# Patient Record
Sex: Female | Born: 1985 | Race: White | Hispanic: No | Marital: Married | Smoking: Never smoker
Health system: Southern US, Community
[De-identification: ages and names within clinical notes are randomized; demographics above are authoritative.]

## PROBLEM LIST (undated history)

## (undated) DIAGNOSIS — Z9109 Other allergy status, other than to drugs and biological substances: Secondary | ICD-10-CM

## (undated) DIAGNOSIS — R519 Headache, unspecified: Secondary | ICD-10-CM

## (undated) DIAGNOSIS — J302 Other seasonal allergic rhinitis: Secondary | ICD-10-CM

## (undated) DIAGNOSIS — O24419 Gestational diabetes mellitus in pregnancy, unspecified control: Secondary | ICD-10-CM

## (undated) DIAGNOSIS — Z8619 Personal history of other infectious and parasitic diseases: Secondary | ICD-10-CM

## (undated) DIAGNOSIS — R87629 Unspecified abnormal cytological findings in specimens from vagina: Secondary | ICD-10-CM

## (undated) DIAGNOSIS — R51 Headache: Secondary | ICD-10-CM

## (undated) DIAGNOSIS — G43909 Migraine, unspecified, not intractable, without status migrainosus: Secondary | ICD-10-CM

## (undated) HISTORY — DX: Unspecified abnormal cytological findings in specimens from vagina: R87.629

## (undated) HISTORY — DX: Personal history of other infectious and parasitic diseases: Z86.19

## (undated) HISTORY — DX: Other seasonal allergic rhinitis: J30.2

## (undated) HISTORY — DX: Migraine, unspecified, not intractable, without status migrainosus: G43.909

## (undated) HISTORY — DX: Other allergy status, other than to drugs and biological substances: Z91.09

## (undated) HISTORY — DX: Headache, unspecified: R51.9

## (undated) HISTORY — PX: DILATION AND CURETTAGE OF UTERUS: SHX78

## (undated) HISTORY — DX: Headache: R51

## (undated) HISTORY — PX: WISDOM TOOTH EXTRACTION: SHX21

---

## 2014-12-22 LAB — OB RESULTS CONSOLE GBS: GBS: NEGATIVE

## 2014-12-30 ENCOUNTER — Encounter: Payer: Self-pay | Admitting: Family Medicine

## 2014-12-30 ENCOUNTER — Encounter (INDEPENDENT_AMBULATORY_CARE_PROVIDER_SITE_OTHER): Payer: Self-pay

## 2014-12-30 ENCOUNTER — Ambulatory Visit (INDEPENDENT_AMBULATORY_CARE_PROVIDER_SITE_OTHER): Payer: BC Managed Care – PPO | Admitting: Family Medicine

## 2014-12-30 VITALS — BP 131/83 | HR 93 | Ht 65.0 in | Wt 230.0 lb

## 2014-12-30 DIAGNOSIS — M25571 Pain in right ankle and joints of right foot: Secondary | ICD-10-CM

## 2014-12-30 NOTE — Patient Instructions (Signed)
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 Ice the area for 15 minutes at a time, 3-4 times a day Aleve 2 tabs twice a day with food OR ibuprofen 3 tabs three times a day with food for pain and inflammation for 7-10 days then as needed. Elevate above the level of your heart when possible Use ankle brace to help with stability for next 4-6 weeks. Start theraband strengthening exercises - once a day 3 sets of 10 for next 6 weeks. Wear shoes with good arch support or add arch support (something like dr. Jari Sportsmanscholls active series insoles). Consider physical therapy for strengthening and balance exercises. I don't think x-rays are necessary at this point - you did not have an injury and do not have focal bony tenderness. Follow up with me in 1 month to 6 weeks.

## 2015-01-05 DIAGNOSIS — M25571 Pain in right ankle and joints of right foot: Secondary | ICD-10-CM | POA: Insufficient documentation

## 2015-01-05 NOTE — Assessment & Plan Note (Signed)
2/2 instability and peroneal tendinopathy.  Icing, nsaids. Ankle brace, better arch support.  Start home exercise program daily.  Consider physical therapy.  F/u in 1 month to 6 weeks.

## 2015-01-05 NOTE — Progress Notes (Signed)
PCP: No primary care provider on file.  Subjective:   HPI: Patient is a 29 y.o. female here for right ankle pain.  Patient denies known injury. She states about a week ago she started getting lateral right ankle pain and swelling. Worse by end of day. Is a Engineer, siteschool teacher and stands a lot. Has been icing, elevating. Not taking any medications.  No past medical history on file.  No current outpatient prescriptions on file prior to visit.   No current facility-administered medications on file prior to visit.    No past surgical history on file.  Allergies  Allergen Reactions  . Amoxicillin   . Hydrocodeine [Dihydrocodeine]   . Penicillins     History   Social History  . Marital Status: Single    Spouse Name: N/A  . Number of Children: N/A  . Years of Education: N/A   Occupational History  . Not on file.   Social History Main Topics  . Smoking status: Never Smoker   . Smokeless tobacco: Not on file  . Alcohol Use: Not on file  . Drug Use: Not on file  . Sexual Activity: Not on file   Other Topics Concern  . Not on file   Social History Narrative  . No narrative on file    No family history on file.  BP 131/83 mmHg  Pulse 93  Ht 5\' 5"  (1.651 m)  Wt 230 lb (104.327 kg)  BMI 38.27 kg/m2  Review of Systems: See HPI above.    Objective:  Physical Exam:  Gen: NAD  Right ankle: Mild swelling peroneal tendon area.  No bruising, other deformity.  Mild-mod overpronation. FROM with pain on ext rotation. TTP peroneal tendon, ATFL.   2+ ant drawer, negative talar tilt.   Negative syndesmotic compression. Thompsons test negative. NV intact distally.    Assessment & Plan:  1. Right ankle pain - 2/2 instability and peroneal tendinopathy.  Icing, nsaids. Ankle brace, better arch support.  Start home exercise program daily.  Consider physical therapy.  F/u in 1 month to 6 weeks.

## 2015-01-12 ENCOUNTER — Encounter: Payer: Self-pay | Admitting: Physician Assistant

## 2015-01-12 ENCOUNTER — Ambulatory Visit (INDEPENDENT_AMBULATORY_CARE_PROVIDER_SITE_OTHER): Payer: BC Managed Care – PPO | Admitting: Physician Assistant

## 2015-01-12 VITALS — BP 132/85 | HR 82 | Temp 98.0°F | Resp 16 | Ht 65.0 in | Wt 249.1 lb

## 2015-01-12 DIAGNOSIS — J019 Acute sinusitis, unspecified: Secondary | ICD-10-CM | POA: Diagnosis not present

## 2015-01-12 DIAGNOSIS — B369 Superficial mycosis, unspecified: Secondary | ICD-10-CM | POA: Diagnosis not present

## 2015-01-12 DIAGNOSIS — J309 Allergic rhinitis, unspecified: Secondary | ICD-10-CM | POA: Diagnosis not present

## 2015-01-12 DIAGNOSIS — B9689 Other specified bacterial agents as the cause of diseases classified elsewhere: Secondary | ICD-10-CM

## 2015-01-12 MED ORDER — LORATADINE-PSEUDOEPHEDRINE ER 10-240 MG PO TB24: 1.0000 | ORAL_TABLET | Freq: Every day | ORAL | Status: DC

## 2015-01-12 MED ORDER — DOXYCYCLINE HYCLATE 100 MG PO CAPS
100.0000 mg | ORAL_CAPSULE | Freq: Two times a day (BID) | ORAL | Status: DC
Start: 2015-01-12 — End: 2015-02-25

## 2015-01-12 MED ORDER — CLOTRIMAZOLE-BETAMETHASONE 1-0.05 % EX CREA
1.0000 | TOPICAL_CREAM | Freq: Two times a day (BID) | CUTANEOUS | Status: DC
Start: 2015-01-12 — End: 2015-09-23

## 2015-01-12 NOTE — Patient Instructions (Signed)
Please take medications as directed. Continue the Flonase. Stay well hydrated. Keep sinuses flushed out with saline nasal spray.  Sinusitis Sinusitis is redness, soreness, and swelling (inflammation) of the paranasal sinuses. Paranasal sinuses are air pockets within the bones of your face (beneath the eyes, the middle of the forehead, or above the eyes). In healthy paranasal sinuses, mucus is able to drain out, and air is able to circulate through them by way of your nose. However, when your paranasal sinuses are inflamed, mucus and air can become trapped. This can allow bacteria and other germs to grow and cause infection. Sinusitis can develop quickly and last only a short time (acute) or continue over a long period (chronic). Sinusitis that lasts for more than 12 weeks is considered chronic.  CAUSES  Causes of sinusitis include:  Allergies.  Structural abnormalities, such as displacement of the cartilage that separates your nostrils (deviated septum), which can decrease the air flow through your nose and sinuses and affect sinus drainage.  Functional abnormalities, such as when the small hairs (cilia) that line your sinuses and help remove mucus do not work properly or are not present. SYMPTOMS  Symptoms of acute and chronic sinusitis are the same. The primary symptoms are pain and pressure around the affected sinuses. Other symptoms include:  Upper toothache.  Earache.  Headache.  Bad breath.  Decreased sense of smell and taste.  A cough, which worsens when you are lying flat.  Fatigue.  Fever.  Thick drainage from your nose, which often is green and may contain pus (purulent).  Swelling and warmth over the affected sinuses. DIAGNOSIS  Your caregiver will perform a physical exam. During the exam, your caregiver may:  Look in your nose for signs of abnormal growths in your nostrils (nasal polyps).  Tap over the affected sinus to check for signs of infection.  View the  inside of your sinuses (endoscopy) with a special imaging device with a light attached (endoscope), which is inserted into your sinuses. If your caregiver suspects that you have chronic sinusitis, one or more of the following tests may be recommended:  Allergy tests.  Nasal culture A sample of mucus is taken from your nose and sent to a lab and screened for bacteria.  Nasal cytology A sample of mucus is taken from your nose and examined by your caregiver to determine if your sinusitis is related to an allergy. TREATMENT  Most cases of acute sinusitis are related to a viral infection and will resolve on their own within 10 days. Sometimes medicines are prescribed to help relieve symptoms (pain medicine, decongestants, nasal steroid sprays, or saline sprays).  However, for sinusitis related to a bacterial infection, your caregiver will prescribe antibiotic medicines. These are medicines that will help kill the bacteria causing the infection.  Rarely, sinusitis is caused by a fungal infection. In theses cases, your caregiver will prescribe antifungal medicine. For some cases of chronic sinusitis, surgery is needed. Generally, these are cases in which sinusitis recurs more than 3 times per year, despite other treatments. HOME CARE INSTRUCTIONS   Drink plenty of water. Water helps thin the mucus so your sinuses can drain more easily.  Use a humidifier.  Inhale steam 3 to 4 times a day (for example, sit in the bathroom with the shower running).  Apply a warm, moist washcloth to your face 3 to 4 times a day, or as directed by your caregiver.  Use saline nasal sprays to help moisten and clean your sinuses.  Take over-the-counter or prescription medicines for pain, discomfort, or fever only as directed by your caregiver. SEEK IMMEDIATE MEDICAL CARE IF:  You have increasing pain or severe headaches.  You have nausea, vomiting, or drowsiness.  You have swelling around your face.  You have  vision problems.  You have a stiff neck.  You have difficulty breathing. MAKE SURE YOU:   Understand these instructions.  Will watch your condition.  Will get help right away if you are not doing well or get worse. Document Released: 10/01/2005 Document Revised: 12/24/2011 Document Reviewed: 10/16/2011 [facility] Patient Information 2014 [place], [place].

## 2015-01-12 NOTE — Assessment & Plan Note (Signed)
Rx Lotrisone cream. Apply to area twice daily for 2 weeks. Follow-up if symptoms not improving.

## 2015-01-12 NOTE — Assessment & Plan Note (Signed)
Continue Flonase daily. We'll also begin Claritin-D daily over the next week. Then switch to Plain Claritin.

## 2015-01-12 NOTE — Progress Notes (Signed)
Patient presents to clinic today to establish care.  Acute Concerns: Patient complains of 2 weeks of gradually worsening sinus pressure, sinus pain, ear pressure, postnasal drip, sore throat and cough that is productive of thick clear to green sputum. Denies chest pain or shortness of breath. Denies history of asthma. Endorses history of seasonal allergies. Is taking Flonase daily. Denies recent travel or sick contact.  Patient also complains of a red and scaly rash of her right lower extremity that has been present for 2 months. Denies similar rash elsewhere. Denies contact with similar rash. Denies fever, chills or arthralgias.  Chronic Issues: Allergic Rhinitis -- long-standing history. Patient is currently on Flonase daily with only moderate relief of symptoms.  Past Medical History  Diagnosis Date  . History of chicken pox   . Frequent headaches   . Migraine   . Seasonal allergies   . Environmental allergies     Past Surgical History  Procedure Laterality Date  . Wisdom tooth extraction      No current outpatient prescriptions on file prior to visit.   No current facility-administered medications on file prior to visit.    Allergies  Allergen Reactions  . Amoxicillin   . Hydrocodeine [Dihydrocodeine]   . Penicillins     Family History  Problem Relation Age of Onset  . Alcoholism Father     Resolved-Living  . Diabetes Mother     Hulda MarinLiivng  . Diabetes Paternal Grandfather   . Hyperlipidemia Mother   . Heart attack Paternal Grandfather   . Heart disease Paternal Aunt     History   Social History  . Marital Status: Single    Spouse Name: N/A  . Number of Children: N/A  . Years of Education: N/A   Occupational History  . Not on file.   Social History Main Topics  . Smoking status: Never Smoker   . Smokeless tobacco: Never Used  . Alcohol Use: 0.0 oz/week    0 Standard drinks or equivalent per week     Comment: rare -- 3 x month  . Drug Use: No  .  Sexual Activity:    Partners: Male   Other Topics Concern  . Not on file   Social History Narrative   ROS Pertinent ROS are listed in the HPI.   BP 132/85 mmHg  Pulse 82  Temp(Src) 98 F (36.7 C) (Oral)  Resp 16  Ht 5\' 5"  (1.651 m)  Wt 249 lb 2 oz (113.002 kg)  BMI 41.46 kg/m2  SpO2 99%  LMP 01/03/2015  Physical Exam  Constitutional: She is oriented to person, place, and time and well-developed, well-nourished, and in no distress.  HENT:  Head: Normocephalic and atraumatic.  Right Ear: Tympanic membrane, external ear and ear canal normal.  Left Ear: Tympanic membrane, external ear and ear canal normal.  Nose: Mucosal edema and rhinorrhea present. Right sinus exhibits frontal sinus tenderness. Left sinus exhibits frontal sinus tenderness.  Mouth/Throat: Uvula is midline, oropharynx is clear and moist and mucous membranes are normal. No oropharyngeal exudate.  Eyes: Conjunctivae are normal.  Cardiovascular: Normal rate, regular rhythm, normal heart sounds and intact distal pulses.   Pulmonary/Chest: Effort normal and breath sounds normal. No respiratory distress. She has no wheezes. She has no rales. She exhibits no tenderness.  Neurological: She is alert and oriented to person, place, and time.  Skin: Skin is warm and dry.  Psychiatric: Affect normal.  Vitals reviewed.  Assessment/Plan: Acute bacterial sinusitis Rx doxycycline, S patient  is penicillin allergic.  Increase fluids.  Rest.  Saline nasal spray.  Probiotic.  Mucinex as directed.  Humidifier in bedroom. Continue Flonase daily.  Call or return to clinic if symptoms are not improving.    Rhinitis, allergic Continue Flonase daily. We'll also begin Claritin-D daily over the next week. Then switch to Plain Claritin.   Fungal dermatitis Rx Lotrisone cream. Apply to area twice daily for 2 weeks. Follow-up if symptoms not improving.

## 2015-01-12 NOTE — Assessment & Plan Note (Signed)
Rx doxycycline, S patient is penicillin allergic.  Increase fluids.  Rest.  Saline nasal spray.  Probiotic.  Mucinex as directed.  Humidifier in bedroom. Continue Flonase daily.  Call or return to clinic if symptoms are not improving.

## 2015-01-12 NOTE — Progress Notes (Signed)
Pre visit review using our clinic review tool, if applicable. No additional management support is needed unless otherwise documented below in the visit note/SLS  

## 2015-02-22 ENCOUNTER — Ambulatory Visit: Payer: BC Managed Care – PPO | Admitting: Physician Assistant

## 2015-02-25 ENCOUNTER — Ambulatory Visit (INDEPENDENT_AMBULATORY_CARE_PROVIDER_SITE_OTHER): Payer: BC Managed Care – PPO | Admitting: Physician Assistant

## 2015-02-25 ENCOUNTER — Encounter: Payer: Self-pay | Admitting: Physician Assistant

## 2015-02-25 ENCOUNTER — Ambulatory Visit (HOSPITAL_BASED_OUTPATIENT_CLINIC_OR_DEPARTMENT_OTHER)
Admission: RE | Admit: 2015-02-25 | Discharge: 2015-02-25 | Disposition: A | Discharge status: Home / Self Care | Payer: BC Managed Care – PPO | Source: Ambulatory Visit | Attending: Physician Assistant | Admitting: Physician Assistant

## 2015-02-25 VITALS — BP 131/82 | HR 97 | Temp 98.0°F | Wt 260.0 lb

## 2015-02-25 DIAGNOSIS — H6983 Other specified disorders of Eustachian tube, bilateral: Secondary | ICD-10-CM

## 2015-02-25 DIAGNOSIS — M25571 Pain in right ankle and joints of right foot: Secondary | ICD-10-CM | POA: Insufficient documentation

## 2015-02-25 DIAGNOSIS — M25471 Effusion, right ankle: Secondary | ICD-10-CM

## 2015-02-25 DIAGNOSIS — H6993 Unspecified Eustachian tube disorder, bilateral: Secondary | ICD-10-CM

## 2015-02-25 MED ORDER — METHYLPREDNISOLONE ACETATE PF 80 MG/ML IJ SUSP: 80.0000 mg | Freq: Once | INTRAMUSCULAR | Status: DC

## 2015-02-25 MED ORDER — METHYLPREDNISOLONE ACETATE 80 MG/ML IJ SUSP
80.0000 mg | Freq: Once | INTRAMUSCULAR | Status: AC
Start: 2015-02-25 — End: 2015-02-25
  Administered 2015-02-25: 80 mg via INTRAMUSCULAR

## 2015-02-25 MED ORDER — MELOXICAM 15 MG PO TABS: 15.0000 mg | ORAL_TABLET | Freq: Every day | ORAL | Status: DC

## 2015-02-25 NOTE — Progress Notes (Signed)
Pre visit review using our clinic review tool, if applicable. No additional management support is needed unless otherwise documented below in the visit note. 

## 2015-02-25 NOTE — Patient Instructions (Signed)
Please take Mobic daily (start tomorrow). Keep ankle braced and elevated.   Apply ice to the area. Go downstairs for x-ray.  I will call you with your results.  The steroid shot should help ear symptoms but continue the Flonase. Follow-up if symptoms are not resolving.

## 2015-02-25 NOTE — Progress Notes (Signed)
Patient presents to clinic today c/o pressure, pain and popping in R ear over the past month.  Denies fever, chills, URI symptoms or malaise. Does have history of allergies for which she has been using Flonase daily.  Denies decreased hearing but endorses sometimes feels muffled.  Patient also c/o 2 months of right ankle pain and swelling.  Denies trauma or injury.  Denies bruising, weakness or numbness.  Pain is worse with ambulation. Patient endorses she has seen Sports Medicine who did not feel there was anything concerning. No imaging has been performed per patient.  Past Medical History  Diagnosis Date  . History of chicken pox   . Frequent headaches   . Migraine   . Seasonal allergies   . Environmental allergies     Current Outpatient Prescriptions on File Prior to Visit  Medication Sig Dispense Refill  . clotrimazole-betamethasone (LOTRISONE) cream Apply 1 application topically 2 (two) times daily. 30 g 0  . Liraglutide -Weight Management 18 MG/3ML SOPN Inject into the skin daily.    Marland Kitchen. loratadine-pseudoephedrine (CLARITIN-D 24 HOUR) 10-240 MG per 24 hr tablet Take 1 tablet by mouth daily. 30 tablet 0  . Multiple Vitamins-Minerals (AIRBORNE GUMMIES) CHEW Chew by mouth daily as needed.     No current facility-administered medications on file prior to visit.    Allergies  Allergen Reactions  . Amoxicillin   . Hydrocodeine [Dihydrocodeine]   . Penicillins     Family History  Problem Relation Age of Onset  . Alcoholism Father     Resolved-Living  . Diabetes Mother     Hulda MarinLiivng  . Diabetes Paternal Grandfather   . Hyperlipidemia Mother   . Heart attack Paternal Grandfather   . Heart disease Paternal Aunt     History   Social History  . Marital Status: Single    Spouse Name: N/A  . Number of Children: N/A  . Years of Education: N/A   Social History Main Topics  . Smoking status: Never Smoker   . Smokeless tobacco: Never Used  . Alcohol Use: 0.0 oz/week    0  Standard drinks or equivalent per week     Comment: rare -- 3 x month  . Drug Use: No  . Sexual Activity:    Partners: Male   Other Topics Concern  . None   Social History Narrative   Review of Systems - See HPI.  All other ROS are negative.  BP 131/82 mmHg  Pulse 97  Temp(Src) 98 F (36.7 C)  Wt 260 lb (117.935 kg)  SpO2 94%  Physical Exam  Constitutional: She is well-developed, well-nourished, and in no distress.  HENT:  Head: Normocephalic and atraumatic.  Right Ear: External ear and ear canal normal. Tympanic membrane is not retracted. A middle ear effusion is present.  Left Ear: Tympanic membrane, external ear and ear canal normal.  Nose: Nose normal.  Mouth/Throat: Uvula is midline, oropharynx is clear and moist and mucous membranes are normal. No oropharyngeal exudate.  Cardiovascular: Normal rate, regular rhythm, normal heart sounds and intact distal pulses.   Pulmonary/Chest: Effort normal. No respiratory distress. She has no wheezes. She has no rales. She exhibits no tenderness.  Musculoskeletal:       Right ankle: She exhibits swelling. She exhibits normal range of motion and no ecchymosis. Tenderness. Lateral malleolus and AITFL tenderness found. No medial malleolus, no CF ligament, no posterior TFL, no head of 5th metatarsal and no proximal fibula tenderness found. Achilles tendon normal.  Vitals  reviewed.   No results found for this or any previous visit (from the past 2160 hour(s)).  Assessment/Plan: Right ankle swelling Reiterated supportive measures.  Rx mobic once daily. Continue bracing.  Will check x-ray to further assess.  If negative, recommend PT.   Eustachian tube dysfunction Secondary to allergic rhinitis. No evidence of infection.  Continue Flonase daily and begin decongestant. IM depomedrol given by nursing staff.

## 2015-02-27 DIAGNOSIS — H6980 Other specified disorders of Eustachian tube, unspecified ear: Secondary | ICD-10-CM | POA: Insufficient documentation

## 2015-02-27 DIAGNOSIS — M25471 Effusion, right ankle: Secondary | ICD-10-CM | POA: Insufficient documentation

## 2015-02-27 NOTE — Assessment & Plan Note (Signed)
Reiterated supportive measures.  Rx mobic once daily. Continue bracing.  Will check x-ray to further assess.  If negative, recommend PT.

## 2015-02-27 NOTE — Assessment & Plan Note (Signed)
Secondary to allergic rhinitis. No evidence of infection.  Continue Flonase daily and begin decongestant. IM depomedrol given by nursing staff.

## 2015-05-10 ENCOUNTER — Encounter: Payer: Self-pay | Admitting: Physician Assistant

## 2015-05-10 ENCOUNTER — Ambulatory Visit (INDEPENDENT_AMBULATORY_CARE_PROVIDER_SITE_OTHER): Payer: BC Managed Care – PPO | Admitting: Physician Assistant

## 2015-05-10 VITALS — BP 134/80 | HR 96 | Temp 98.3°F | Ht 65.0 in | Wt 273.8 lb

## 2015-05-10 DIAGNOSIS — J019 Acute sinusitis, unspecified: Secondary | ICD-10-CM

## 2015-05-10 DIAGNOSIS — B9689 Other specified bacterial agents as the cause of diseases classified elsewhere: Secondary | ICD-10-CM | POA: Insufficient documentation

## 2015-05-10 LAB — POCT URINE PREGNANCY: PREG TEST UR: NEGATIVE

## 2015-05-10 MED ORDER — METHYLPREDNISOLONE ACETATE 40 MG/ML IJ SUSP
40.0000 mg | Freq: Once | INTRAMUSCULAR | Status: AC
  Administered 2015-05-10: 40 mg via INTRAMUSCULAR

## 2015-05-10 MED ORDER — DOXYCYCLINE HYCLATE 100 MG PO CAPS: 100.0000 mg | ORAL_CAPSULE | Freq: Two times a day (BID) | ORAL | Status: DC

## 2015-05-10 NOTE — Patient Instructions (Signed)
Please take antibiotic as directed.  Increase fluid intake.  Use Saline nasal spray.  Take a daily multivitamin. Continue Claritin daily.  Place a humidifier in the bedroom. The steroid shot given will help to reduce pressure and inflammation. Alternate tylenol and ibuprofen if needed for headache.  Please call or return clinic if symptoms are not improving.  Sinusitis Sinusitis is redness, soreness, and swelling (inflammation) of the paranasal sinuses. Paranasal sinuses are air pockets within the bones of your face (beneath the eyes, the middle of the forehead, or above the eyes). In healthy paranasal sinuses, mucus is able to drain out, and air is able to circulate through them by way of your nose. However, when your paranasal sinuses are inflamed, mucus and air can become trapped. This can allow bacteria and other germs to grow and cause infection. Sinusitis can develop quickly and last only a short time (acute) or continue over a long period (chronic). Sinusitis that lasts for more than 12 weeks is considered chronic.  CAUSES  Causes of sinusitis include:  Allergies.  Structural abnormalities, such as displacement of the cartilage that separates your nostrils (deviated septum), which can decrease the air flow through your nose and sinuses and affect sinus drainage.  Functional abnormalities, such as when the small hairs (cilia) that line your sinuses and help remove mucus do not work properly or are not present. SYMPTOMS  Symptoms of acute and chronic sinusitis are the same. The primary symptoms are pain and pressure around the affected sinuses. Other symptoms include:  Upper toothache.  Earache.  Headache.  Bad breath.  Decreased sense of smell and taste.  A cough, which worsens when you are lying flat.  Fatigue.  Fever.  Thick drainage from your nose, which often is green and may contain pus (purulent).  Swelling and warmth over the affected sinuses. DIAGNOSIS  Your  caregiver will perform a physical exam. During the exam, your caregiver may:  Look in your nose for signs of abnormal growths in your nostrils (nasal polyps).  Tap over the affected sinus to check for signs of infection.  View the inside of your sinuses (endoscopy) with a special imaging device with a light attached (endoscope), which is inserted into your sinuses. If your caregiver suspects that you have chronic sinusitis, one or more of the following tests may be recommended:  Allergy tests.  Nasal culture A sample of mucus is taken from your nose and sent to a lab and screened for bacteria.  Nasal cytology A sample of mucus is taken from your nose and examined by your caregiver to determine if your sinusitis is related to an allergy. TREATMENT  Most cases of acute sinusitis are related to a viral infection and will resolve on their own within 10 days. Sometimes medicines are prescribed to help relieve symptoms (pain medicine, decongestants, nasal steroid sprays, or saline sprays).  However, for sinusitis related to a bacterial infection, your caregiver will prescribe antibiotic medicines. These are medicines that will help kill the bacteria causing the infection.  Rarely, sinusitis is caused by a fungal infection. In theses cases, your caregiver will prescribe antifungal medicine. For some cases of chronic sinusitis, surgery is needed. Generally, these are cases in which sinusitis recurs more than 3 times per year, despite other treatments. HOME CARE INSTRUCTIONS   Drink plenty of water. Water helps thin the mucus so your sinuses can drain more easily.  Use a humidifier.  Inhale steam 3 to 4 times a day (for example, sit  in the bathroom with the shower running).  Apply a warm, moist washcloth to your face 3 to 4 times a day, or as directed by your caregiver.  Use saline nasal sprays to help moisten and clean your sinuses.  Take over-the-counter or prescription medicines for pain,  discomfort, or fever only as directed by your caregiver. SEEK IMMEDIATE MEDICAL CARE IF:  You have increasing pain or severe headaches.  You have nausea, vomiting, or drowsiness.  You have swelling around your face.  You have vision problems.  You have a stiff neck.  You have difficulty breathing. MAKE SURE YOU:   Understand these instructions.  Will watch your condition.  Will get help right away if you are not doing well or get worse. Document Released: 10/01/2005 Document Revised: 12/24/2011 Document Reviewed: 10/16/2011 [facility] Patient Information 2014 [place], [place].

## 2015-05-10 NOTE — Progress Notes (Signed)
   History of Present Illness: Alicia Christensen is a 29 y.o. female who present to the clinic today complaining of sinus pressure, sinus pain, headache and nasal congestion. Patient endorses left ear pressure and pain.  Patient denies cough, shortness of breath, chest pain.   History: Past Medical History  Diagnosis Date  . History of chicken pox   . Frequent headaches   . Migraine   . Seasonal allergies   . Environmental allergies     Current outpatient prescriptions:  .  clotrimazole-betamethasone (LOTRISONE) cream, Apply 1 application topically 2 (two) times daily., Disp: 30 g, Rfl: 0 .  loratadine-pseudoephedrine (CLARITIN-D 24 HOUR) 10-240 MG per 24 hr tablet, Take 1 tablet by mouth daily., Disp: 30 tablet, Rfl: 0 .  meloxicam (MOBIC) 15 MG tablet, Take 1 tablet (15 mg total) by mouth daily., Disp: 30 tablet, Rfl: 0 .  Multiple Vitamins-Minerals (AIRBORNE GUMMIES) CHEW, Chew by mouth daily as needed., Disp: , Rfl:  .  doxycycline (VIBRAMYCIN) 100 MG capsule, Take 1 capsule (100 mg total) by mouth 2 (two) times daily., Disp: 20 capsule, Rfl: 0  Current facility-administered medications:  .  methylPREDNISolone acetate (DEPO-MEDROL) injection 40 mg, 40 mg, Intramuscular, Once, Waldon Merl, PA-C Allergies  Allergen Reactions  . Amoxicillin   . Hydrocodeine [Dihydrocodeine]   . Penicillins    Family History  Problem Relation Age of Onset  . Alcoholism Father     Resolved-Living  . Diabetes Mother     Hulda Marin  . Diabetes Paternal Grandfather   . Hyperlipidemia Mother   . Heart attack Paternal Grandfather   . Heart disease Paternal Aunt    History   Social History  . Marital Status: Single    Spouse Name: N/A  . Number of Children: N/A  . Years of Education: N/A   Social History Main Topics  . Smoking status: Never Smoker   . Smokeless tobacco: Never Used  . Alcohol Use: 0.0 oz/week    0 Standard drinks or equivalent per week     Comment: rare -- 3 x month  .  Drug Use: No  . Sexual Activity:    Partners: Male   Other Topics Concern  . None   Social History Narrative    Review of Systems: See HPI.  All other ROS are negative.  Physical Examination: BP 134/80 mmHg  Pulse 96  Temp(Src) 98.3 F (36.8 C) (Oral)  Ht  (1.651 m)  Wt 273 lb 12.8 oz (124.195 kg)  BMI 45.56 kg/m2  SpO2 98%  LMP 04/17/2015  General appearance: alert, cooperative and appears stated age Head: Normocephalic, without obvious abnormality, atraumatic, sinuses tender to percussion Eyes: conjunctivae/corneas clear. PERRL, EOM's intact. Fundi benign. Ears: normal TM's and external ear canals both ears Nose: moderate congestion, turbinates swollen, sinus tenderness left Throat: lips, mucosa, and tongue normal; teeth and gums normal Neck: no adenopathy, no carotid bruit, no JVD, supple, symmetrical, trachea midline and thyroid not enlarged, symmetric, no tenderness/mass/nodules Lungs: clear to auscultation bilaterally Chest wall: no tenderness  Labs/Diagnostics: Urine pregnancy -- negative.  Assessment/Plan: Acute bacterial sinusitis Urine pregnancy checked as patient is trying to conceive. LMP 1st week of July. Urine pregnancy negative. Rx Doxycycline.  40 IM Depomedrol given.  Increase fluids.  Rest.  Saline nasal spray.  Probiotic.  Mucinex as directed.  Humidifier in bedroom.  Call or return to clinic if symptoms are not improving.

## 2015-05-10 NOTE — Assessment & Plan Note (Signed)
Urine pregnancy checked as patient is trying to conceive. LMP 1st week of July. Urine pregnancy negative. Rx Doxycycline.  40 IM Depomedrol given.  Increase fluids.  Rest.  Saline nasal spray.  Probiotic.  Mucinex as directed.  Humidifier in bedroom.  Call or return to clinic if symptoms are not improving.

## 2015-05-10 NOTE — Progress Notes (Signed)
Pre visit review using our clinic review tool, if applicable. No additional management support is needed unless otherwise documented below in the visit note. 

## 2015-06-01 ENCOUNTER — Telehealth: Payer: Self-pay | Admitting: Physician Assistant

## 2015-06-01 NOTE — Telephone Encounter (Signed)
She would need reassessment to assess neef for further antibiotics especially giving we now know she is pregnant

## 2015-06-01 NOTE — Telephone Encounter (Signed)
Patient's mother states Keyara has been having a headache, cough, and congestion.  She was previously seen for sinus infection and ear concerns-The fluid behind her ear has resolved.  Symptoms improved but did not resolve fully and has now gotten worse. She is now [redacted] weeks pregnant, and wants to make sure anything she takes is safe for pregnancy.  Please advise.

## 2015-06-01 NOTE — Telephone Encounter (Signed)
Caller name:Karen Relationship to patient:mother Can be reached:[phone] Pharmacy:  Reason for call:Got antibiotics a few weeks ago but not all the way better.  Should she repeat antibiotic or what   She is pregnant

## 2015-06-01 NOTE — Telephone Encounter (Signed)
Notified mom and she stated she will let her daughter know to schedule (not available currently).

## 2015-06-15 LAB — OB RESULTS CONSOLE ANTIBODY SCREEN: ANTIBODY SCREEN: NEGATIVE

## 2015-06-15 LAB — OB RESULTS CONSOLE GC/CHLAMYDIA
Chlamydia: NEGATIVE
GC PROBE AMP, GENITAL: NEGATIVE

## 2015-06-15 LAB — OB RESULTS CONSOLE RPR: RPR: NONREACTIVE

## 2015-06-15 LAB — OB RESULTS CONSOLE HEPATITIS B SURFACE ANTIGEN: Hepatitis B Surface Ag: NEGATIVE

## 2015-06-15 LAB — OB RESULTS CONSOLE ABO/RH: RH Type: POSITIVE

## 2015-06-15 LAB — OB RESULTS CONSOLE HIV ANTIBODY (ROUTINE TESTING): HIV: NONREACTIVE

## 2015-06-15 LAB — OB RESULTS CONSOLE RUBELLA ANTIBODY, IGM: RUBELLA: IMMUNE

## 2015-08-22 ENCOUNTER — Institutional Professional Consult (permissible substitution): Payer: BC Managed Care – PPO | Admitting: Internal Medicine

## 2015-09-02 ENCOUNTER — Ambulatory Visit (INDEPENDENT_AMBULATORY_CARE_PROVIDER_SITE_OTHER): Payer: BC Managed Care – PPO | Admitting: Internal Medicine

## 2015-09-02 ENCOUNTER — Institutional Professional Consult (permissible substitution): Payer: BC Managed Care – PPO | Admitting: Internal Medicine

## 2015-09-02 ENCOUNTER — Ambulatory Visit (INDEPENDENT_AMBULATORY_CARE_PROVIDER_SITE_OTHER)
Admission: RE | Admit: 2015-09-02 | Discharge: 2015-09-02 | Disposition: A | Discharge status: Home / Self Care | Payer: BC Managed Care – PPO | Source: Ambulatory Visit | Attending: Internal Medicine | Admitting: Internal Medicine

## 2015-09-02 ENCOUNTER — Encounter: Payer: Self-pay | Admitting: Internal Medicine

## 2015-09-02 VITALS — BP 110/78 | HR 98 | Ht 65.0 in | Wt 276.0 lb

## 2015-09-02 DIAGNOSIS — R058 Other specified cough: Secondary | ICD-10-CM | POA: Insufficient documentation

## 2015-09-02 DIAGNOSIS — R05 Cough: Secondary | ICD-10-CM

## 2015-09-02 MED ORDER — PANTOPRAZOLE SODIUM 40 MG PO TBEC: 40.0000 mg | DELAYED_RELEASE_TABLET | Freq: Every day | ORAL | Status: DC

## 2015-09-02 MED ORDER — FAMOTIDINE 20 MG PO TABS: ORAL_TABLET | ORAL | Status: DC

## 2015-09-02 MED ORDER — PREDNISONE 10 MG PO TABS: ORAL_TABLET | ORAL | Status: DC

## 2015-09-02 NOTE — Patient Instructions (Addendum)
Pantoprazole (protonix) 40 mg   Take  30-60 min before first meal of the day and Pepcid (famotidine)  20 mg one @  bedtime until return to office - this is the best way to tell whether stomach acid is contributing to your problem.     For drainage / throat tickle try take CHLORPHENIRAMINE  4 mg - take one every 4 hours as needed - available over the counter- may cause drowsiness so start with just a bedtime dose or two and see how you tolerate it before trying in daytime   For cough > delsym 2tsp every 12 hours as needed    Prednisone 10 mg take  4 each am x 2 days,   2 each am x 2 days,  1 each am x 2 days and stop   Ok to check with Dr Damian Leavellavon's office first and let me know if any of these meds aren't acceptable    GERD (REFLUX)  is an extremely common cause of respiratory symptoms just like yours , many times with no obvious heartburn at all.    It can be treated with medication, but also with lifestyle changes including elevation of the head of your bed (ideally with 6 inch  bed blocks),  Smoking cessation, avoidance of late meals, excessive alcohol, and avoid fatty foods, chocolate, peppermint, colas, red wine, and acidic juices such as orange juice.  NO MINT OR MENTHOL PRODUCTS SO NO COUGH DROPS  USE SUGARLESS CANDY INSTEAD (Jolley ranchers or Stover's or Life Savers) or even ice chips will also do - the key is to swallow to prevent all throat clearing.  NO OIL BASED VITAMINS - use powdered substitutes.   Please remember to go to the  x-ray department downstairs for your tests - we will call you with the results when they are available.

## 2015-09-02 NOTE — Progress Notes (Signed)
Subjective:    Patient ID: Alicia Christensen, female    DOB: 07/15/1986,    MRN: [id number]  HPI  40 yowf never smoker with seasonal asthma never cough with it, mostly rhinitis fall > spring referred by Dr Billy Coast for refractory cough during pregnancy.   09/02/2015 1st [facility] office visit/ Kennah Hehr   Chief Complaint  Patient presents with  . Pulmonary Consult    Referred by Dr Theresa Mulligan. Pt c/o cough and SOB for the past 5 months. Cough is occ prod with clear to yellow sputum. She states that she feels like she is unable to take a good, deep breath.  She coughs until she vomits at least 3 x per day and sometimes loses urinary continence.   onset of symptoms at around 4 weeks of gestation and now at 71 weeksand gradually worse esp at hs  Cough meds just make her nauseated / coughs to point of vomiting/ urinary incont / really min mucus production assoc with min nasal congestion/ sense of pnds - mild doe but not at rest unless during severe coughing fits  No obvious other patterns in day to day or daytime variabilty or assoc  cp or chest tightness, subjective wheeze or overt   hb symptoms. No unusual exp hx or h/o childhood pna/ asthma or knowledge of premature birth.  Sleeping ok without nocturnal  or early am exacerbation  of respiratory  c/o's or need for noct saba. Also denies any obvious fluctuation of symptoms with weather or environmental changes or other aggravating or alleviating factors except as outlined above   Current Medications, Allergies, Complete Past Medical History, Past Surgical History, Family History, and Social History were reviewed in [employer] record.            Review of Systems  Constitutional: Negative for fever, chills and unexpected weight change.  HENT: Positive for congestion and sore throat. Negative for dental problem, ear pain, nosebleeds, postnasal drip, rhinorrhea, sinus pressure, sneezing, trouble swallowing and voice  change.   Eyes: Negative for visual disturbance.  Respiratory: Positive for cough and shortness of breath. Negative for choking.   Cardiovascular: Negative for chest pain and leg swelling.  Gastrointestinal: Positive for abdominal pain. Negative for vomiting and diarrhea.  Genitourinary: Negative for difficulty urinating.  Musculoskeletal: Negative for arthralgias.  Skin: Negative for rash.  Neurological: Positive for headaches. Negative for tremors and syncope.  Hematological: Does not bruise/bleed easily.       Objective:   Physical Exam  amb wf nad  Wt Readings from Last 3 Encounters:  09/02/15 276 lb (125.193 kg)  05/10/15 273 lb 12.8 oz (124.195 kg)  02/25/15 260 lb (117.935 kg)    Vital signs reviewed    HEENT: nl dentition, turbinates, and oropharynx. Nl external ear canals without cough reflex   NECK :  without JVD/Nodes/TM/ nl carotid upstrokes bilaterally   LUNGS: no acc muscle use, clear to A and P bilaterally without cough on insp or exp maneuvers   CV:  RRR  no s3 or murmur or increase in P2, no edema   ABD:  Obese soft and nontender and c/w IUP stated gestation  with nl excursion in the supine position. No bruits or organomegaly, bowel sounds nl  MS:  warm without deformities, calf tenderness, cyanosis or clubbing  SKIN: warm and dry without lesions    NEURO:  alert, approp, no deficits     CXR PA and Lateral:   09/02/2015 :  I personally reviewed images and agree with radiology impression as follows:    Mild central bronchial thickening. No infiltrate, consolidation or collapse.          Assessment & Plan:

## 2015-09-03 ENCOUNTER — Encounter: Payer: Self-pay | Admitting: Internal Medicine

## 2015-09-03 NOTE — Assessment & Plan Note (Signed)
Body mass index is 45.93 kg/(m^2).  No results found for: TSH   Contributing to gerd tendency/ doe/reviewed the need and the process to achieve and maintain neg calorie balance > defer f/u primary care including intermittently monitoring thyroid status

## 2015-09-03 NOTE — Assessment & Plan Note (Signed)
The most common causes of chronic cough in immunocompetent adults include the following: upper airway cough syndrome (UACS), previously referred to as postnasal drip syndrome (PNDS), which is caused by variety of rhinosinus conditions; (2) asthma; (3) GERD; (4) chronic bronchitis from cigarette smoking or other inhaled environmental irritants; (5) nonasthmatic eosinophilic bronchitis; and (6) bronchiectasis.   These conditions, singly or in combination, have accounted for up to 94% of the causes of chronic cough in prospective studies.   Other conditions have constituted no >6% of the causes in prospective studies These have included bronchogenic carcinoma, chronic interstitial pneumonia, sarcoidosis, left ventricular failure, ACEI-induced cough, and aspiration from a condition associated with pharyngeal dysfunction.    Chronic cough is often simultaneously caused by more than one condition. A single cause has been found from 38 to 82% of the time, multiple causes from 18 to 62%. Multiply caused cough has been the result of three diseases up to 42% of the time.       Based on hx and exam, this is most likely:  Classic Upper airway cough syndrome, so named because it's frequently impossible to sort out how much is  CR/sinusitis with freq throat clearing (which can be related to primary GERD)   vs  causing  secondary (" extra esophageal")  GERD from wide swings in gastric pressure that occur with throat clearing, often  promoting self use of mint and menthol lozenges that reduce the lower esophageal sphincter tone and exacerbate the problem further in a cyclical fashion.   These are the same pts (now being labeled as having "irritable larynx syndrome" by some cough centers) who not infrequently have a history of having failed to tolerate ace inhibitors,  dry powder inhalers or biphosphonates or report having atypical reflux symptoms that don't respond to standard doses of PPI , and are easily confused as  having aecopd or asthma flares by even experienced allergists/ pulmonologists.   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  I had an extended discussion with the patient reviewing all relevant studies completed to date and  lasting 35 min/60 min ov   1) Discussed in detail all the  indications, usual  risks and alternatives  relative to the benefits with patient who agrees to proceed with conservativew/u for now with just a cxr then 2 week trial of meds which have no strict contraindication in pregnancy in this pt who is exposing the fetus to extremely high intrabd pressure and probably hyperventilating as well which is not good for fetal oxygen delivery.  2) Each maintenance medication was reviewed in detail including most importantly the difference between maintenance and prns and under what circumstances the prns are to be triggered using an action plan format that is not reflected in the computer generated alphabetically organized AVS.    Please see instructions for details which were reviewed in writing and the patient given a copy highlighting the part that I personally wrote and discussed at today's ov.   See instructions for specific recommendations which were reviewed directly with the patient who was given a copy with highlighter outlining the key components.

## 2015-09-20 ENCOUNTER — Telehealth: Payer: Self-pay | Admitting: Internal Medicine

## 2015-09-20 NOTE — Telephone Encounter (Signed)
Alicia Christensen is aware of MW's recommendation. She asks that we contact the pt directly to make the appointment. Spoke with pt's mother. OV has been scheduled for 09/23/15 at 4pm (pt could only come in after 3pm). Nothing further was needed.

## 2015-09-20 NOTE — Telephone Encounter (Signed)
Pt added back to schedule 09/23/15 with MW at 4pm - okayed in previous telephone note to double book at 4pm Nothing further needed.

## 2015-09-20 NOTE — Telephone Encounter (Signed)
Will need ov with all meds in hand, ok to overbook

## 2015-09-20 NOTE — Telephone Encounter (Signed)
Spoke with Delaney Meigsamara at Dr. Damian Leavellavon's office, states that cough is unchanged since last ov in our office.  Pt was seen yesterday by Dr. Rosemary Holmsavon, who is requesting further recs from Dr. Sherene SiresWert re: pt's cough.  At last ov with MW, pt was put on the following regimen for cough: protonix 40mg  qam, pepcid 20mg  qhs, chlorpheniramine 4mg  q4h prn, delsym 2tsp q12h prn, pred taper, and GERD diet.  MW please advise on any further recs for pt.  Thanks!  (be sure to call Delaney Meigsamara back with recs)

## 2015-09-23 ENCOUNTER — Ambulatory Visit (INDEPENDENT_AMBULATORY_CARE_PROVIDER_SITE_OTHER): Payer: BC Managed Care – PPO | Admitting: Internal Medicine

## 2015-09-23 ENCOUNTER — Encounter: Payer: Self-pay | Admitting: Internal Medicine

## 2015-09-23 ENCOUNTER — Ambulatory Visit: Payer: BC Managed Care – PPO | Admitting: Internal Medicine

## 2015-09-23 VITALS — BP 122/86 | HR 102 | Ht 65.0 in | Wt 277.0 lb

## 2015-09-23 DIAGNOSIS — R05 Cough: Secondary | ICD-10-CM | POA: Diagnosis not present

## 2015-09-23 DIAGNOSIS — J309 Allergic rhinitis, unspecified: Secondary | ICD-10-CM | POA: Diagnosis not present

## 2015-09-23 DIAGNOSIS — R058 Other specified cough: Secondary | ICD-10-CM

## 2015-09-23 MED ORDER — FLUTICASONE PROPIONATE 50 MCG/ACT NA SUSP: NASAL | Status: DC

## 2015-09-23 MED ORDER — CEFDINIR 300 MG PO CAPS: 300.0000 mg | ORAL_CAPSULE | Freq: Two times a day (BID) | ORAL | Status: DC

## 2015-09-23 MED ORDER — FLUTTER DEVI: Status: DC

## 2015-09-23 NOTE — Progress Notes (Signed)
Subjective:    Patient ID: Alicia Christensen, female    DOB: March 21, 1986,    MRN: [id number]    Brief patient profile:  32 yowf never smoker with seasonal asthma never cough with it, mostly rhinitis fall > spring referred by Dr Alicia Christensen for refractory cough during pregnancy.   History of Present Illness  09/02/2015 1st [facility] office visit/ [name]   Chief Complaint  Patient presents with  . Pulmonary Consult    Referred by Dr Alicia Christensen. Pt c/o cough and SOB for the past 5 months. Cough is occ prod with clear to yellow sputum. She states that she feels like she is unable to take a good, deep breath.  She coughs until she vomits at least 3 x per day and sometimes loses urinary continence.   onset of symptoms at around 4 weeks of gestation and now at 20 weeks and gradually worse esp at hs  Cough meds just make her nauseated / coughs to point of vomiting/ urinary incont / really min mucus production assoc with min nasal congestion/ sense of pnds - mild doe but not at rest unless during severe coughing fits rec Pantoprazole (protonix) 40 mg   Take  30-60 min before first meal of the day and Pepcid (famotidine)  20 mg one @  bedtime until return to office - this is the best way to tell whether stomach acid is contributing to your problem.   For drainage / throat tickle try take CHLORPHENIRAMINE  4 mg - take one every 4 hours as needed - available over the counter- may cause drowsiness so start with just a bedtime dose or two and see how you tolerate it before trying in daytime  For cough > delsym 2tsp every 12 hours as needed   Prednisone 10 mg take  4 each am x 2 days,   2 each am x 2 days,  1 each am x 2 days and stop  GERD  Diet   09/23/2015  f/u ov/Alicia Christensen re: refractory cough with vomiting  In IUP now about [redacted] weeks gestation  Chief Complaint  Patient presents with  . Follow-up    pt following for upper airway cough syndrome: pt states cough is about the same since she was last  here. pt c.o prod cough white or yellow in color. pt was in [place] on thanskgiving and went to the er for a coughig episode and pulled muscles in her stomach. pt completed a course of prednisone and was perscribed a resue inhailer that she doesnt get much relief from. pt c/o sore throat.   no better with prednisone or albuterol and cough remains harsh and min productive/ 24/7 and sleeping in recliner  Did not get h1 or stay on acid rx as rec   No obvious day to day or daytime variability or assoc mucus plug production or  cp or chest tightness, subjective wheeze or overt sinus or hb symptoms. No unusual exp hx or h/o childhood pna/ asthma or knowledge of premature birth.  Sleeping ok without nocturnal  or early am exacerbation  of respiratory  c/o's or need for noct saba. Also denies any obvious fluctuation of symptoms with weather or environmental changes or other aggravating or alleviating factors except as outlined above   Current Medications, Allergies, Complete Past Medical History, Past Surgical History, Family History, and Social History were reviewed in [employer] record.  ROS  The following are not active complaints unless bolded sore  throat, dysphagia, dental problems, itching, sneezing,  nasal congestion or excess/ purulent secretions, ear ache,   fever, chills, sweats, unintended wt loss, classically pleuritic or exertional cp, hemoptysis,  orthopnea pnd or leg swelling, presyncope, palpitations, abdominal pain, anorexia, nausea, vomiting, diarrhea  or change in bowel or bladder habits, change in stools or urine, dysuria,hematuria,  rash, arthralgias, visual complaints, headache, numbness, weakness or ataxia or problems with walking or coordination,  change in mood/affect or memory.           .        Objective:   Physical Exam  amb despondent wf nad until starts a coughing fit/ classic upper airway features with pseudowheezing   09/23/2015         277  09/02/15 276 lb (125.193 kg)  05/10/15 273 lb 12.8 oz (124.195 kg)  02/25/15 260 lb (117.935 kg)    Vital signs reviewed    HEENT: nl dentition, mod nonspecific swelling bilateral turbinates, and nl oropharynx(pristine) . Nl external ear canals without cough reflex   NECK :  without JVD/Nodes/TM/ nl carotid upstrokes bilaterally   LUNGS: no acc muscle use, clear to A and P bilaterally without cough on insp or exp maneuvers   CV:  RRR  no s3 or murmur or increase in P2, no edema   ABD:  Obese soft and nontender and c/w IUP stated gestation  with nl excursion in the supine position. No bruits or organomegaly, bowel sounds nl  MS:  warm without deformities, calf tenderness, cyanosis or clubbing  SKIN: warm and dry without lesions    NEURO:  alert, approp, no deficits     CXR PA and Lateral:   09/02/2015 :    I personally reviewed images and agree with radiology impression as follows:    Mild central bronchial thickening. No infiltrate, consolidation or collapse.          Assessment & Plan:

## 2015-09-23 NOTE — Patient Instructions (Addendum)
Every time you feel the urge the cough, cough into the flutter valve  Pantoprazole (protonix) 40 mg   Take  30-60 min before first meal of the day and Pepcid (famotidine)  20 mg one @  bedtime until return to office - this is the best way to tell whether stomach acid is contributing to your problem.    For drainage / throat tickle try take CHLORPHENIRAMINE  4 mg - take one every 4 hours as needed - available over the counter- may cause drowsiness so start with just a bedtime dose or two and see how you tolerate it before trying in daytime   For cough > robitussin per bottle   omnicef 300 mg one twice daily x 7 days   I emphasized that nasal steroids (flonase) have no immediate benefit in terms of improving symptoms.  To help them reached the target tissue, the patient should use Afrin two puffs every 12 hours applied one min before using the nasal steroids.  Afrin should be stopped after no more than 5 days.  If the symptoms worsen, Afrin can be restarted after 5 days off of therapy to prevent rebound congestion from overuse of Afrin.  I also emphasized that in no way are nasal steroids a concern in terms of "addiction".   If any problems with any of these recommendations call me right away  Please schedule a follow up office visit in 2 weeks, sooner if needed

## 2015-09-25 ENCOUNTER — Encounter: Payer: Self-pay | Admitting: Internal Medicine

## 2015-09-25 NOTE — Assessment & Plan Note (Signed)
Body mass index is 46.1 with pregnancy vs baseline obesity   No results found for: TSH   Contributing to gerd tendency/ doe/reviewed the need and the process to achieve and maintain neg calorie balance > defer f/u primary care including intermittently monitoring thyroid status

## 2015-09-25 NOTE — Assessment & Plan Note (Signed)
I emphasized that nasal steroids have no immediate benefit in terms of improving symptoms.  To help them reached the target tissue, the patient should use Afrin two puffs every 12 hours applied one min before using the nasal steroids.  Afrin should be stopped after no more than 5 days.  If the symptoms worsen, Afrin can be restarted after 5 days off of therapy to prevent rebound congestion from overuse of Afrin.  I also emphasized that in no way are nasal steroids a concern in terms of "addiction".  

## 2015-09-25 NOTE — Assessment & Plan Note (Addendum)
Max gerd rx 09/02/2015 > did not maintain on   I had an extended discussion with the patient reviewing all relevant studies completed to date and  lasting 15 to 20 minutes of a 25 minute visit    1) clearly she is coughing so hard she is refluxing so needs to stay on a heavy acid suppression regimen and reflux diet until she returns here.  2) unfortunately there is no satisfactory cough suppressant she can use other than Robitussin because of intolerance. If she were really in distress and not pregnant I would add either Demerol or Dilaudid  at this point but will defer that to Dr. Spero Geraldseran judgment. The bottom line is  If she keeps coughing like this she's going to keep coughing like this.  3) rec add omnicef x 10 day rx to cover ? Sinus source since pcn allergy was not serious and there's only a 10% overlap anyway  4) Unlike when you get a prescription for eyeglasses, it's not possible to always walk out of this or any medical office with a perfect prescription that is immediately effective  based on any test that we offer here.    On the contrary, it may take several weeks for the full impact of changes recommened today - hopefully you will respond well.  If not, then we'll adjust your medication on your next visit accordingly, knowing more then than we can possibly know now.      5)  Each maintenance medication was reviewed in detail including most importantly the difference between maintenance and prns and under what circumstances the prns are to be triggered using an action plan format that is not reflected in the computer generated alphabetically organized AVS.  Advised:  If not able or willing to follow these instructions to the letter needs to contact this office rather than ad lib on the recs or we won't be able to help her   Please see instructions for details which were reviewed in writing and the patient given a copy highlighting the part that I personally wrote and discussed at today's ov.

## 2015-11-08 ENCOUNTER — Telehealth: Payer: Self-pay | Admitting: Physician Assistant

## 2015-11-08 NOTE — Telephone Encounter (Signed)
Relation to WJ:XBJY Call back [phone] Pharmacy:  Reason for call:  Mother called stated that daughter has been coughing since she's been pregnant, OB prescribed antibiotics and its not working and mother would like patient to be seen today please advise.

## 2015-11-08 NOTE — Telephone Encounter (Signed)
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

## 2015-11-08 NOTE — Telephone Encounter (Signed)
I am full. Can you schedule her with another provider here please or at one of our other locations

## 2015-11-09 ENCOUNTER — Encounter (INDEPENDENT_AMBULATORY_CARE_PROVIDER_SITE_OTHER): Payer: Self-pay

## 2015-11-09 ENCOUNTER — Encounter: Payer: Self-pay | Admitting: Physician Assistant

## 2015-11-09 ENCOUNTER — Ambulatory Visit (INDEPENDENT_AMBULATORY_CARE_PROVIDER_SITE_OTHER): Payer: BC Managed Care – PPO | Admitting: Physician Assistant

## 2015-11-09 VITALS — BP 120/72 | HR 104 | Temp 98.3°F | Ht 65.0 in | Wt 288.0 lb

## 2015-11-09 DIAGNOSIS — R058 Other specified cough: Secondary | ICD-10-CM

## 2015-11-09 DIAGNOSIS — R05 Cough: Secondary | ICD-10-CM | POA: Diagnosis not present

## 2015-11-09 MED ORDER — CHLORPHENIRAMINE MALEATE 2 MG/5ML PO SYRP: 2.0000 mg | ORAL_SOLUTION | ORAL | Status: DC | PRN

## 2015-11-09 NOTE — Progress Notes (Signed)
Pre visit review using our clinic review tool, if applicable. No additional management support is needed unless otherwise documented below in the visit note. 

## 2015-11-09 NOTE — Progress Notes (Signed)
Patient presents to clinic today c/o 5 months of a continued dry cough. Has been evaluated by Pulmonology on more than one occasion. Symptoms did not respond to antibiotics. Patient was diagnosed with upper airway cough syndrome and supportive measures were reviewed. Prescription options are limited as patient is currently 6 months pregnant. Patient endorses nasal congestion with runny nose, watery eyes and PND. Denies fever, chills, sinus pain, chest congestion or SOB. Has taken Flonase with little relief in symptoms.  Past Medical History  Diagnosis Date  . History of chicken pox   . Frequent headaches   . Migraine   . Seasonal allergies   . Environmental allergies     Current Outpatient Prescriptions on File Prior to Visit  Medication Sig Dispense Refill  . fluticasone (FLONASE) 50 MCG/ACT nasal spray One twice daily each nostril    . Multiple Vitamins-Minerals (AIRBORNE GUMMIES) CHEW Chew by mouth daily as needed.    . famotidine (PEPCID) 20 MG tablet One at bedtime (Patient not taking: Reported on 09/23/2015) 30 tablet 2  . pantoprazole (PROTONIX) 40 MG tablet Take 1 tablet (40 mg total) by mouth daily. Take 30-60 min before first meal of the day (Patient not taking: Reported on 09/23/2015) 30 tablet 2   No current facility-administered medications on file prior to visit.    Allergies  Allergen Reactions  . Amoxicillin   . Hydrocodeine [Dihydrocodeine]   . Penicillins     Family History  Problem Relation Age of Onset  . Alcoholism Father     Resolved-Living  . Diabetes Mother     Hulda Marin  . Diabetes Paternal Grandfather   . Hyperlipidemia Mother   . Heart attack Paternal Grandfather   . Heart disease Paternal Aunt     Social History   Social History  . Marital Status: Single    Spouse Name: N/A  . Number of Children: N/A  . Years of Education: N/A   Social History Main Topics  . Smoking status: Never Smoker   . Smokeless tobacco: Never Used  . Alcohol Use: 0.0  oz/week    0 Standard drinks or equivalent per week     Comment: rare -- 3 x month  . Drug Use: No  . Sexual Activity:    Partners: Male   Other Topics Concern  . None   Social History Narrative   Review of Systems - See HPI.  All other ROS are negative.  BP 120/72 mmHg  Pulse 104  Temp(Src) 98.3 F (36.8 C) (Oral)  Ht  (1.651 m)  Wt 288 lb (130.636 kg)  BMI 47.93 kg/m2  SpO2 98%  LMP 04/17/2015  Physical Exam  Constitutional: She is oriented to person, place, and time and well-developed, well-nourished, and in no distress.  HENT:  Head: Normocephalic and atraumatic.  Right Ear: Tympanic membrane normal.  Left Ear: Tympanic membrane normal.  Nose: Rhinorrhea present.  Mouth/Throat: Uvula is midline, oropharynx is clear and moist and mucous membranes are normal.  Eyes: Conjunctivae are normal.  Neck: Neck supple.  Cardiovascular: Normal rate, regular rhythm, normal heart sounds and intact distal pulses.   Pulmonary/Chest: Effort normal and breath sounds normal. No respiratory distress. She has no wheezes. She has no rales. She exhibits no tenderness.  Neurological: She is alert and oriented to person, place, and time.  Skin: Skin is warm and dry. No rash noted.  Psychiatric: Affect normal.  Vitals reviewed.   No results found for this or any previous visit (from the  past 2160 hour(s)).  Assessment/Plan: Upper airway cough syndrome Patient has stopped acid reducers. She has been encouraged to restart. Feel her issue is combination of GERD with allergic rhinitis and PND. Recommend increased fluids, saline nasal spray, Benadryl in the evening and Chlor-Trimeton as directed. Follow-up with Pulmonology if not improving.

## 2015-11-09 NOTE — Patient Instructions (Signed)
Continue the Flonase as directed. Take a Benadryl at night and use the Chlor-Trimeton as directed during the day for allergies and cough. Increase fluids and place a humidifier in the bedroom. Suck on hard sugar-free candy to help with cough.   Follow-up with Dr. Sherene Sires if not improving.

## 2015-11-10 NOTE — Assessment & Plan Note (Signed)
Patient has stopped acid reducers. She has been encouraged to restart. Feel her issue is combination of GERD with allergic rhinitis and PND. Recommend increased fluids, saline nasal spray, Benadryl in the evening and Chlor-Trimeton as directed. Follow-up with Pulmonology if not improving.

## 2015-11-23 ENCOUNTER — Inpatient Hospital Stay (HOSPITAL_COMMUNITY)
Admission: AD | Admit: 2015-11-23 | Payer: BC Managed Care – PPO | Source: Ambulatory Visit | Admitting: Obstetrics and Gynecology

## 2015-12-22 LAB — OB RESULTS CONSOLE GBS: GBS: NEGATIVE

## 2016-01-03 ENCOUNTER — Other Ambulatory Visit: Payer: Self-pay | Admitting: Obstetrics and Gynecology

## 2016-01-11 ENCOUNTER — Telehealth (HOSPITAL_COMMUNITY): Payer: Self-pay | Admitting: *Deleted

## 2016-01-11 ENCOUNTER — Encounter (HOSPITAL_COMMUNITY): Payer: Self-pay | Admitting: *Deleted

## 2016-01-11 NOTE — Telephone Encounter (Signed)
Preadmission screen  

## 2016-01-13 ENCOUNTER — Encounter (HOSPITAL_COMMUNITY): Payer: Self-pay | Admitting: *Deleted

## 2016-01-13 ENCOUNTER — Telehealth (HOSPITAL_COMMUNITY): Payer: Self-pay | Admitting: *Deleted

## 2016-01-13 NOTE — Telephone Encounter (Signed)
Preadmission screen  

## 2016-01-14 ENCOUNTER — Other Ambulatory Visit: Payer: Self-pay | Admitting: Obstetrics and Gynecology

## 2016-01-14 NOTE — H&P (Signed)
Alicia Christensen is a 30 y.o. female presenting for IOL for A2DM and gestational HTN. Maternal Medical History:  Reason for admission: Contractions.   Contractions: Onset was less than 1 hour ago.   Frequency: rare.   Perceived severity is mild.    Fetal activity: Perceived fetal activity is normal.   Last perceived fetal movement was within the past hour.    Prenatal complications: no prenatal complications Prenatal Complications - Diabetes: gestational. Diabetes is managed by oral agent (monotherapy).      OB History    Gravida Para Term Preterm AB TAB SAB Ectopic Multiple Living   2 0   1 1         Past Medical History  Diagnosis Date  . Frequent headaches   . Migraine   . Seasonal allergies   . Environmental allergies   . Hx of varicella   . Vaginal Pap smear, abnormal    Past Surgical History  Procedure Laterality Date  . Wisdom tooth extraction    . Dilation and curettage of uterus     Family History: family history includes Alcoholism in her father; Diabetes in her mother and paternal grandfather; Heart attack in her maternal grandfather and paternal grandfather; Heart disease in her paternal aunt; Hyperlipidemia in her mother; Hypertension in her maternal grandmother; Varicose Veins in her mother. Social History:  reports that she has never smoked. She has never used smokeless tobacco. She reports that she drinks alcohol. She reports that she does not use illicit drugs.   Prenatal Transfer Tool  Maternal Diabetes: Yes:  Diabetes Type:  Insulin/Medication controlled Genetic Screening: Normal Maternal Ultrasounds/Referrals: Normal Fetal Ultrasounds or other Referrals:  None Maternal Substance Abuse:  No Significant Maternal Medications:  None Significant Maternal Lab Results:  None Other Comments:  None  Review of Systems  Constitutional: Negative.   All other systems reviewed and are negative.     Last menstrual period 04/17/2015. Maternal Exam:   Uterine Assessment: Contraction strength is mild.  Contraction frequency is rare.   Abdomen: Patient reports no abdominal tenderness. Fetal presentation: vertex  Introitus: Normal vulva. Normal vagina.  Ferning test: not done.  Nitrazine test: not done. Amniotic fluid character: not assessed.  Pelvis: questionable for delivery.   Cervix: Cervix evaluated by digital exam.     Physical Exam  Nursing note and vitals reviewed. Constitutional: She is oriented to person, place, and time. She appears well-developed and well-nourished.  HENT:  Head: Normocephalic and atraumatic.  Neck: Normal range of motion. Neck supple.  Cardiovascular: Normal rate and regular rhythm.   Respiratory: Effort normal and breath sounds normal.  GI: Soft. Bowel sounds are normal.  Genitourinary: Vagina normal and uterus normal.  Musculoskeletal: Normal range of motion.  Neurological: She is alert and oriented to person, place, and time. She has normal reflexes.  Skin: Skin is warm and dry.  Psychiatric: She has a normal mood and affect.    Prenatal labs: ABO, Rh: O/Positive/-- (08/31 0000) Antibody: Negative (08/31 0000) Rubella: Immune (08/31 0000) RPR: Nonreactive (08/31 0000)  HBsAg: Negative (08/31 0000)  HIV: Non-reactive (08/31 0000)  GBS:     Assessment/Plan: 40 week IUP A2DM Gestational HTN Admit Ck labs BS q 4   Anayeli Arel J 01/14/2016, 10:07 PM

## 2016-01-15 ENCOUNTER — Encounter (HOSPITAL_COMMUNITY)
Admission: RE | Disposition: A | Discharge status: Home / Self Care | Payer: Self-pay | Source: Ambulatory Visit | Attending: Obstetrics and Gynecology

## 2016-01-15 ENCOUNTER — Inpatient Hospital Stay (HOSPITAL_COMMUNITY): Payer: BC Managed Care – PPO | Admitting: Anesthesiology

## 2016-01-15 ENCOUNTER — Encounter (HOSPITAL_COMMUNITY): Payer: Self-pay

## 2016-01-15 ENCOUNTER — Inpatient Hospital Stay (HOSPITAL_COMMUNITY)
Admission: RE | Admit: 2016-01-15 | Discharge: 2016-01-18 | DRG: 765 | Disposition: A | Discharge status: Home / Self Care | Payer: BC Managed Care – PPO | Source: Ambulatory Visit | Attending: Obstetrics and Gynecology | Admitting: Obstetrics and Gynecology

## 2016-01-15 DIAGNOSIS — O134 Gestational [pregnancy-induced] hypertension without significant proteinuria, complicating childbirth: Secondary | ICD-10-CM | POA: Diagnosis present

## 2016-01-15 DIAGNOSIS — Z8249 Family history of ischemic heart disease and other diseases of the circulatory system: Secondary | ICD-10-CM | POA: Diagnosis not present

## 2016-01-15 DIAGNOSIS — O9081 Anemia of the puerperium: Secondary | ICD-10-CM | POA: Diagnosis present

## 2016-01-15 DIAGNOSIS — O99214 Obesity complicating childbirth: Secondary | ICD-10-CM | POA: Diagnosis present

## 2016-01-15 DIAGNOSIS — O24420 Gestational diabetes mellitus in childbirth, diet controlled: Secondary | ICD-10-CM | POA: Diagnosis present

## 2016-01-15 DIAGNOSIS — Z3A39 39 weeks gestation of pregnancy: Secondary | ICD-10-CM | POA: Diagnosis not present

## 2016-01-15 DIAGNOSIS — O24919 Unspecified diabetes mellitus in pregnancy, unspecified trimester: Secondary | ICD-10-CM | POA: Diagnosis present

## 2016-01-15 DIAGNOSIS — Z6841 Body Mass Index (BMI) 40.0 and over, adult: Secondary | ICD-10-CM

## 2016-01-15 DIAGNOSIS — D62 Acute posthemorrhagic anemia: Secondary | ICD-10-CM | POA: Diagnosis present

## 2016-01-15 DIAGNOSIS — Z833 Family history of diabetes mellitus: Secondary | ICD-10-CM | POA: Diagnosis not present

## 2016-01-15 DIAGNOSIS — O411230 Chorioamnionitis, third trimester, not applicable or unspecified: Secondary | ICD-10-CM | POA: Diagnosis present

## 2016-01-15 LAB — CBC
HCT: 31.8 % — ABNORMAL LOW (ref 36.0–46.0)
HEMOGLOBIN: 10.2 g/dL — AB (ref 12.0–15.0)
MCH: 25.7 pg — AB (ref 26.0–34.0)
MCHC: 32.1 g/dL (ref 30.0–36.0)
MCV: 80.1 fL (ref 78.0–100.0)
Platelets: 276 10*3/uL (ref 150–400)
RBC: 3.97 MIL/uL (ref 3.87–5.11)
RDW: 17.7 % — AB (ref 11.5–15.5)
WBC: 10.1 10*3/uL (ref 4.0–10.5)

## 2016-01-15 LAB — COMPREHENSIVE METABOLIC PANEL
ALBUMIN: 2.7 g/dL — AB (ref 3.5–5.0)
ALK PHOS: 106 U/L (ref 38–126)
ALT: 10 U/L — ABNORMAL LOW (ref 14–54)
ANION GAP: 8 (ref 5–15)
AST: 18 U/L (ref 15–41)
BILIRUBIN TOTAL: 0.4 mg/dL (ref 0.3–1.2)
BUN: 8 mg/dL (ref 6–20)
CALCIUM: 8 mg/dL — AB (ref 8.9–10.3)
CO2: 20 mmol/L — AB (ref 22–32)
Chloride: 107 mmol/L (ref 101–111)
Creatinine, Ser: 0.52 mg/dL (ref 0.44–1.00)
GFR calc non Af Amer: >60 mL/min (ref >60)
GLUCOSE: 110 mg/dL — AB (ref 65–99)
POTASSIUM: 3.8 mmol/L (ref 3.5–5.1)
SODIUM: 135 mmol/L (ref 135–145)
TOTAL PROTEIN: 5.8 g/dL — AB (ref 6.5–8.1)

## 2016-01-15 LAB — TYPE AND SCREEN
ABO/RH(D): O POS
ANTIBODY SCREEN: NEGATIVE

## 2016-01-15 LAB — GLUCOSE, CAPILLARY
GLUCOSE-CAPILLARY: 85 mg/dL (ref 65–99)
Glucose-Capillary: 110 mg/dL — ABNORMAL HIGH (ref 65–99)
Glucose-Capillary: 103 mg/dL — ABNORMAL HIGH (ref 65–99)
Glucose-Capillary: 110 mg/dL — ABNORMAL HIGH (ref 65–99)

## 2016-01-15 LAB — RPR: RPR: NONREACTIVE

## 2016-01-15 LAB — ABO/RH: ABO/RH(D): O POS

## 2016-01-15 LAB — LACTATE DEHYDROGENASE: LDH: 156 U/L (ref 98–192)

## 2016-01-15 SURGERY — Surgical Case
Anesthesia: Epidural

## 2016-01-15 MED ORDER — MEPERIDINE HCL 25 MG/ML IJ SOLN
INTRAMUSCULAR | Status: DC | PRN
Start: 2016-01-15 — End: 2016-01-15
  Administered 2016-01-15 (×2): 12.5 mg via INTRAVENOUS

## 2016-01-15 MED ORDER — ZOLPIDEM TARTRATE 5 MG PO TABS
5.0000 mg | ORAL_TABLET | Freq: Every evening | ORAL | Status: DC | PRN
  Administered 2016-01-15: 5 mg via ORAL
  Filled 2016-01-15: qty 1

## 2016-01-15 MED ORDER — OXYTOCIN 10 UNIT/ML IJ SOLN
1.0000 m[IU]/min | INTRAVENOUS | Status: DC
  Filled 2016-01-15: qty 4

## 2016-01-15 MED ORDER — OXYTOCIN 10 UNIT/ML IJ SOLN
INTRAMUSCULAR | Status: AC
  Filled 2016-01-15: qty 4

## 2016-01-15 MED ORDER — OXYTOCIN BOLUS FROM INFUSION: 500.0000 mL | INTRAVENOUS | Status: DC

## 2016-01-15 MED ORDER — LIDOCAINE HCL (PF) 1 % IJ SOLN
INTRAMUSCULAR | Status: DC | PRN
  Administered 2016-01-15 (×2): 5 mL

## 2016-01-15 MED ORDER — FLEET ENEMA 7-19 GM/118ML RE ENEM: 1.0000 | ENEMA | RECTAL | Status: DC | PRN

## 2016-01-15 MED ORDER — MORPHINE SULFATE (PF) 0.5 MG/ML IJ SOLN
INTRAMUSCULAR | Status: DC | PRN
  Administered 2016-01-15: 4 mg via EPIDURAL
  Administered 2016-01-15: 1 mg via INTRAVENOUS

## 2016-01-15 MED ORDER — BUPIVACAINE HCL (PF) 0.25 % IJ SOLN
INTRAMUSCULAR | Status: DC | PRN
  Administered 2016-01-15: 20 mL

## 2016-01-15 MED ORDER — EPHEDRINE 5 MG/ML INJ: 10.0000 mg | INTRAVENOUS | Status: DC | PRN

## 2016-01-15 MED ORDER — LACTATED RINGERS IV SOLN: 500.0000 mL | INTRAVENOUS | Status: DC | PRN

## 2016-01-15 MED ORDER — DEXTROSE 5 % IV SOLN
2.0000 g | INTRAVENOUS | Status: AC
  Administered 2016-01-15: 2 g via INTRAVENOUS
  Filled 2016-01-15: qty 2

## 2016-01-15 MED ORDER — KETAMINE HCL 10 MG/ML IJ SOLN
INTRAMUSCULAR | Status: DC | PRN
  Administered 2016-01-15: 20 mg via INTRAVENOUS
  Administered 2016-01-15: 10 mg via INTRAVENOUS
  Administered 2016-01-15: 20 mg via INTRAVENOUS
  Administered 2016-01-15: 10 mg via INTRAVENOUS

## 2016-01-15 MED ORDER — LACTATED RINGERS IV SOLN
INTRAVENOUS | Status: DC | PRN
  Administered 2016-01-15: 23:00:00 via INTRAVENOUS

## 2016-01-15 MED ORDER — BUPIVACAINE HCL (PF) 0.25 % IJ SOLN
INTRAMUSCULAR | Status: AC
  Filled 2016-01-15: qty 10

## 2016-01-15 MED ORDER — SODIUM BICARBONATE 8.4 % IV SOLN
INTRAVENOUS | Status: DC | PRN
  Administered 2016-01-15 (×3): 5 mL via EPIDURAL

## 2016-01-15 MED ORDER — OXYTOCIN 10 UNIT/ML IJ SOLN
1.0000 m[IU]/min | INTRAVENOUS | Status: DC
  Administered 2016-01-15: 2 m[IU]/min via INTRAVENOUS

## 2016-01-15 MED ORDER — LACTATED RINGERS IV SOLN
500.0000 mL | Freq: Once | INTRAVENOUS | Status: AC
  Administered 2016-01-15: 500 mL via INTRAVENOUS

## 2016-01-15 MED ORDER — OXYCODONE-ACETAMINOPHEN 5-325 MG PO TABS: 1.0000 | ORAL_TABLET | ORAL | Status: DC | PRN

## 2016-01-15 MED ORDER — SODIUM CHLORIDE 0.9 % IJ SOLN
INTRAMUSCULAR | Status: AC
  Filled 2016-01-15: qty 20

## 2016-01-15 MED ORDER — PHENYLEPHRINE 40 MCG/ML (10ML) SYRINGE FOR IV PUSH (FOR BLOOD PRESSURE SUPPORT)
80.0000 ug | PREFILLED_SYRINGE | INTRAVENOUS | Status: DC | PRN
  Filled 2016-01-15: qty 20

## 2016-01-15 MED ORDER — TERBUTALINE SULFATE 1 MG/ML IJ SOLN: 0.2500 mg | Freq: Once | INTRAMUSCULAR | Status: DC | PRN

## 2016-01-15 MED ORDER — FENTANYL CITRATE (PF) 100 MCG/2ML IJ SOLN: 100.0000 ug | INTRAMUSCULAR | Status: DC | PRN

## 2016-01-15 MED ORDER — LIDOCAINE HCL (PF) 1 % IJ SOLN: 30.0000 mL | INTRAMUSCULAR | Status: DC | PRN

## 2016-01-15 MED ORDER — METOCLOPRAMIDE HCL 5 MG/ML IJ SOLN
INTRAMUSCULAR | Status: AC
  Filled 2016-01-15: qty 2

## 2016-01-15 MED ORDER — MEPERIDINE HCL 25 MG/ML IJ SOLN
INTRAMUSCULAR | Status: AC
  Filled 2016-01-15: qty 1

## 2016-01-15 MED ORDER — ONDANSETRON HCL 4 MG/2ML IJ SOLN
4.0000 mg | Freq: Four times a day (QID) | INTRAMUSCULAR | Status: DC | PRN
  Administered 2016-01-15: 4 mg via INTRAVENOUS
  Filled 2016-01-15 (×2): qty 2

## 2016-01-15 MED ORDER — OXYTOCIN 10 UNIT/ML IJ SOLN: 2.5000 [IU]/h | INTRAVENOUS | Status: DC

## 2016-01-15 MED ORDER — DEXAMETHASONE SODIUM PHOSPHATE 10 MG/ML IJ SOLN
INTRAMUSCULAR | Status: AC
  Filled 2016-01-15: qty 1

## 2016-01-15 MED ORDER — DIPHENHYDRAMINE HCL 50 MG/ML IJ SOLN: 12.5000 mg | INTRAMUSCULAR | Status: DC | PRN

## 2016-01-15 MED ORDER — LACTATED RINGERS IV SOLN
INTRAVENOUS | Status: DC
  Administered 2016-01-15 (×4): via INTRAVENOUS

## 2016-01-15 MED ORDER — FENTANYL 2.5 MCG/ML BUPIVACAINE 1/10 % EPIDURAL INFUSION (WH - ANES)
14.0000 mL/h | INTRAMUSCULAR | Status: DC | PRN
  Administered 2016-01-15: 14 mL/h via EPIDURAL
  Administered 2016-01-15: 1 mL/h via EPIDURAL
  Filled 2016-01-15 (×2): qty 125

## 2016-01-15 MED ORDER — DEXTROSE 5 % IV SOLN: 2.0000 g | INTRAVENOUS | Status: DC

## 2016-01-15 MED ORDER — FENTANYL CITRATE (PF) 100 MCG/2ML IJ SOLN
INTRAMUSCULAR | Status: AC
  Filled 2016-01-15: qty 2

## 2016-01-15 MED ORDER — MISOPROSTOL 25 MCG QUARTER TABLET: 25.0000 ug | ORAL_TABLET | ORAL | Status: DC | PRN

## 2016-01-15 MED ORDER — MORPHINE SULFATE (PF) 0.5 MG/ML IJ SOLN
INTRAMUSCULAR | Status: AC
  Filled 2016-01-15: qty 10

## 2016-01-15 MED ORDER — SCOPOLAMINE 1 MG/3DAYS TD PT72
MEDICATED_PATCH | TRANSDERMAL | Status: DC | PRN
  Administered 2016-01-15: 1 via TRANSDERMAL

## 2016-01-15 MED ORDER — CITRIC ACID-SODIUM CITRATE 334-500 MG/5ML PO SOLN
30.0000 mL | ORAL | Status: DC | PRN
  Administered 2016-01-15: 30 mL via ORAL
  Filled 2016-01-15: qty 15

## 2016-01-15 MED ORDER — ONDANSETRON HCL 4 MG/2ML IJ SOLN
INTRAMUSCULAR | Status: AC
  Filled 2016-01-15: qty 2

## 2016-01-15 MED ORDER — PHENYLEPHRINE 40 MCG/ML (10ML) SYRINGE FOR IV PUSH (FOR BLOOD PRESSURE SUPPORT): 80.0000 ug | PREFILLED_SYRINGE | INTRAVENOUS | Status: DC | PRN

## 2016-01-15 MED ORDER — METOCLOPRAMIDE HCL 5 MG/ML IJ SOLN
INTRAMUSCULAR | Status: DC | PRN
  Administered 2016-01-15: 10 mg via INTRAVENOUS

## 2016-01-15 MED ORDER — ACETAMINOPHEN 325 MG PO TABS
650.0000 mg | ORAL_TABLET | ORAL | Status: DC | PRN
  Administered 2016-01-15: 650 mg via ORAL
  Filled 2016-01-15: qty 2

## 2016-01-15 MED ORDER — OXYCODONE-ACETAMINOPHEN 5-325 MG PO TABS: 2.0000 | ORAL_TABLET | ORAL | Status: DC | PRN

## 2016-01-15 MED ORDER — DEXAMETHASONE SODIUM PHOSPHATE 10 MG/ML IJ SOLN
INTRAMUSCULAR | Status: DC | PRN
  Administered 2016-01-15: 10 mg via INTRAVENOUS

## 2016-01-15 MED ORDER — ONDANSETRON HCL 4 MG/2ML IJ SOLN
4.0000 mg | Freq: Once | INTRAMUSCULAR | Status: AC
  Administered 2016-01-15: 4 mg via INTRAVENOUS

## 2016-01-15 MED ORDER — KETAMINE HCL 10 MG/ML IJ SOLN
INTRAMUSCULAR | Status: AC
  Filled 2016-01-15: qty 1

## 2016-01-15 MED ORDER — OXYTOCIN 10 UNIT/ML IJ SOLN
40.0000 [IU] | INTRAVENOUS | Status: DC | PRN
  Administered 2016-01-15: 40 [IU] via INTRAVENOUS

## 2016-01-15 SURGICAL SUPPLY — 37 items
CHLORAPREP W/TINT 26ML (MISCELLANEOUS) ×3 IMPLANT
CLAMP CORD UMBIL (MISCELLANEOUS) IMPLANT
CLOTH BEACON ORANGE TIMEOUT ST (SAFETY) ×3 IMPLANT
CONTAINER PREFILL 10% NBF 15ML (MISCELLANEOUS) IMPLANT
DRSG OPSITE POSTOP 4X10 (GAUZE/BANDAGES/DRESSINGS) ×3 IMPLANT
ELECT REM PT RETURN 9FT ADLT (ELECTROSURGICAL) ×3
ELECTRODE REM PT RTRN 9FT ADLT (ELECTROSURGICAL) ×1 IMPLANT
EXTRACTOR VACUUM M CUP 4 TUBE (SUCTIONS) IMPLANT
EXTRACTOR VACUUM M CUP 4' TUBE (SUCTIONS)
GLOVE BIO SURGEON STRL SZ7.5 (GLOVE) ×7 IMPLANT
GLOVE BIOGEL PI IND STRL 7.0 (GLOVE) ×3 IMPLANT
GLOVE BIOGEL PI INDICATOR 7.0 (GLOVE) ×6
GOWN STRL REUS W/TWL LRG LVL3 (GOWN DISPOSABLE) ×9 IMPLANT
KIT ABG SYR 3ML LUER SLIP (SYRINGE) ×3 IMPLANT
NDL HYPO 25X5/8 SAFETYGLIDE (NEEDLE) IMPLANT
NDL SPNL 20GX3.5 QUINCKE YW (NEEDLE) IMPLANT
NEEDLE HYPO 22GX1.5 SAFETY (NEEDLE) ×3 IMPLANT
NEEDLE HYPO 25X5/8 SAFETYGLIDE (NEEDLE) IMPLANT
NEEDLE SPNL 20GX3.5 QUINCKE YW (NEEDLE) IMPLANT
NS IRRIG 1000ML POUR BTL (IV SOLUTION) ×3 IMPLANT
PACK C SECTION WH (CUSTOM PROCEDURE TRAY) ×3 IMPLANT
PAD ABD 8X7 1/2 STERILE (GAUZE/BANDAGES/DRESSINGS) ×2 IMPLANT
PENCIL SMOKE EVAC W/HOLSTER (ELECTROSURGICAL) ×3 IMPLANT
SPONGE GAUZE 4X4 12PLY STER LF (GAUZE/BANDAGES/DRESSINGS) ×6 IMPLANT
SUT MNCRL 0 VIOLET CTX 36 (SUTURE) ×2 IMPLANT
SUT MNCRL AB 3-0 PS2 27 (SUTURE) ×3 IMPLANT
SUT MON AB 2-0 CT1 27 (SUTURE) ×3 IMPLANT
SUT MON AB-0 CT1 36 (SUTURE) ×8 IMPLANT
SUT MONOCRYL 0 CTX 36 (SUTURE) ×6
SUT PLAIN 0 NONE (SUTURE) IMPLANT
SUT PLAIN 2 0 (SUTURE)
SUT PLAIN 2 0 XLH (SUTURE) IMPLANT
SUT PLAIN ABS 2-0 CT1 27XMFL (SUTURE) IMPLANT
SYR 20CC LL (SYRINGE) IMPLANT
SYR CONTROL 10ML LL (SYRINGE) ×5 IMPLANT
TOWEL OR 17X24 6PK STRL BLUE (TOWEL DISPOSABLE) ×3 IMPLANT
TRAY FOLEY CATH SILVER 14FR (SET/KITS/TRAYS/PACK) ×3 IMPLANT

## 2016-01-15 NOTE — Transfer of Care (Signed)
Immediate Anesthesia Transfer of Care Note  Patient: Alicia Christensen  Procedure(s) Performed: Procedure(s): CESAREAN SECTION (N/A)  Patient Location: PACU  Anesthesia Type:Epidural  Level of Consciousness: awake, alert , oriented and patient cooperative  Airway & Oxygen Therapy: Patient Spontanous Breathing  Post-op Assessment: Report given to RN, Post -op Vital signs reviewed and stable and Patient moving all extremities X 4  Post vital signs: Reviewed and stable  Last Vitals:  Filed Vitals:   01/15/16 2158 01/15/16 2215  BP: 119/60   Pulse: 156   Temp:    Resp:  18    Complications: No apparent anesthesia complications

## 2016-01-15 NOTE — Consults (Signed)
  Anesthesia Pain Consult Note  Patient: Lennox PippinsAllison N Seibert, 30 y.o., female  Consult Requested by: Olivia Mackieichard Taavon, MD  Reason for Consult: CRNA Pain Management Epidural planned Pain 0 Goal 3

## 2016-01-15 NOTE — Anesthesia Procedure Notes (Signed)
Epidural Patient location during procedure: OB  Staffing Anesthesiologist: Amariana Mirando Performed by: anesthesiologist   Preanesthetic Checklist Completed: patient identified, site marked, surgical consent, pre-op evaluation, timeout performed, IV checked, risks and benefits discussed and monitors and equipment checked  Epidural Patient position: sitting Prep: DuraPrep Patient monitoring: heart rate, continuous pulse ox and blood pressure Approach: right paramedian Location: L3-L4 Injection technique: LOR saline  Needle:  Needle type: Tuohy  Needle gauge: 17 G Needle length: 9 cm and 9 Needle insertion depth: 9 cm Catheter type: closed end flexible Catheter size: 20 Guage Catheter at skin depth: 13 cm Test dose: negative  Assessment Events: blood not aspirated, injection not painful, no injection resistance, negative IV test and no paresthesia  Additional Notes Patient identified. Risks/Benefits/Options discussed with patient including but not limited to bleeding, infection, nerve damage, paralysis, failed block, incomplete pain control, headache, blood pressure changes, nausea, vomiting, reactions to medication both or allergic, itching and postpartum back pain. Confirmed with bedside nurse the patient's most recent platelet count. Confirmed with patient that they are not currently taking any anticoagulation, have any bleeding history or any family history of bleeding disorders. Patient expressed understanding and wished to proceed. All questions were answered. Sterile technique was used throughout the entire procedure. Please see nursing notes for vital signs. Test dose was given through epidural needle and negative prior to continuing to dose epidural or start infusion. Warning signs of high block given to the patient including shortness of breath, tingling/numbness in hands, complete motor block, or any concerning symptoms with instructions to call for help. Patient was given  instructions on fall risk and not to get out of bed. All questions and concerns addressed with instructions to call with any issues.   

## 2016-01-15 NOTE — Progress Notes (Signed)
Lennox Pippinsllison N Cuyler is a 30 y.o. G2P0010 at 5363w0d by LMP admitted for induction of labor due to Gestational diabetes and Hypertension.  Subjective: Getting uncomfortable   Objective: BP 131/83 mmHg  Pulse 92  Temp(Src) 98 F (36.7 C) (Oral)  Resp 17  Ht 5\' 5"  (1.651 m)  Wt 127.007 kg (280 lb)  BMI 46.59 kg/m2  LMP 04/17/2015      FHT:  FHR: 145 bpm, variability: moderate,  accelerations:  Present,  decelerations:  Absent UC:   irregular, every 2-4 minutes SVE:   4/80/-1 AROM- clear IUPC placed without difficulty  Labs: Lab Results  Component Value Date   WBC 10.1 01/15/2016   HGB 10.2* 01/15/2016   HCT 31.8* 01/15/2016   MCV 80.1 01/15/2016   PLT 276 01/15/2016  BS 103-110  Assessment / Plan: Induction of labor due to gestational hypertension and gestational diabetes,  progressing well on pitocin  Labor: Progressing normally Preeclampsia:  no signs or symptoms of toxicity Fetal Wellbeing:  Category I Pain Control:  Labor support without medications I/D:  n/a Anticipated MOD:  guarded  Audyn Dimercurio J 01/15/2016, 10:42 AM

## 2016-01-15 NOTE — Progress Notes (Signed)
Alicia Christensen is a 30 y.o. G2P0010 at 494w0d by LMP admitted for induction of labor due to Gestational diabetes and Hypertension.  Subjective: comfortable  Objective: BP 150/99 mmHg  Pulse 101  Temp(Src) 98.3 F (36.8 C) (Oral)  Resp 16  Ht 5\' 5"  (1.651 m)  Wt 127.007 kg (280 lb)  BMI 46.59 kg/m2  SpO2 99%  LMP 04/17/2015      FHT:  FHR: 145 bpm, variability: moderate,  accelerations:  Present,  decelerations:  Absent UC:   regular, every 3 minutes SVE:   Dilation: 6 Effacement (%): 80 Station: -1 Exam by:: Sharlet Notaro, MD  Labs: Lab Results  Component Value Date   WBC 10.1 01/15/2016   HGB 10.2* 01/15/2016   HCT 31.8* 01/15/2016   MCV 80.1 01/15/2016   PLT 276 01/15/2016    Assessment / Plan: Induction of labor due to gestational hypertension and gestational diabetes,  progressing well on pitocin  Labor: Progressing normally Preeclampsia:  no signs or symptoms of toxicity Fetal Wellbeing:  Category I Pain Control:  Epidural I/D:  n/a Anticipated MOD:  guarded  Redonna Wilbert J 01/15/2016, 3:27 PM

## 2016-01-15 NOTE — Progress Notes (Signed)
Alicia Christensen is a 30 y.o. G2P0010 at 3762w0d by LMP admitted for induction of labor due to Gestational diabetes and Hypertension.  Subjective: Feels tired  Objective: BP 116/66 mmHg  Pulse 118  Temp(Src) 98.5 F (36.9 C) (Oral)  Resp 17  Ht 5\' 5"  (1.651 m)  Wt 127.007 kg (280 lb)  BMI 46.59 kg/m2  SpO2 99%  LMP 04/17/2015 I/O last 3 completed shifts: In: -  Out: 900 [Urine:900]    FHT:  FHR: 155 bpm, variability: moderate,  accelerations:  Present,  decelerations:  Present occ early UC:   regular, every 2 minutes SVE:   Dilation: 8.5 Effacement (%): 90 Station: -1 Exam by:: Dr. Billy Christensen  160-200 by IUPC  Labs: Lab Results  Component Value Date   WBC 10.1 01/15/2016   HGB 10.2* 01/15/2016   HCT 31.8* 01/15/2016   MCV 80.1 01/15/2016   PLT 276 01/15/2016    Assessment / Plan: Induction of labor due to gestational hypertension and gestational diabetes,  progressing well on pitocin  Labor: Progressing normally Preeclampsia:  no signs or symptoms of toxicity Fetal Wellbeing:  Category I Pain Control:  Epidural I/D:  n/a Anticipated MOD:  guarded  Alicia Christensen J 01/15/2016, 7:30 PM

## 2016-01-15 NOTE — Op Note (Signed)
Cesarean Section Procedure Note  Indications: non-reassuring fetal status  Pre-operative Diagnosis: 39 week 0 day pregnancy.  Post-operative Diagnosis: same  Surgeon: Lenoard AdenAAVON,Jasun Gasparini J   Assistants: Cherly Hensenousins, MD  Anesthesia: Epidural anesthesia and Local anesthesia 0.25.% bupivacaine  ASA Class: 2  Procedure Details  The patient was seen in the Holding Room. The risks, benefits, complications, treatment options, and expected outcomes were discussed with the patient.  The patient concurred with the proposed plan, giving informed consent. The risks of anesthesia, infection, bleeding and possible injury to other organs discussed. Injury to bowel, bladder, or ureter with possible need for repair discussed. Possible need for transfusion with secondary risks of hepatitis or HIV acquisition discussed. Post operative complications to include but not limited to DVT, PE and Pneumonia noted. The site of surgery properly noted/marked. The patient was taken to Operating Room # 1, identified as Alicia Christensen and the procedure verified as C-Section Delivery. A Time Out was held and the above information confirmed.  After induction of anesthesia, the patient was draped and prepped in the usual sterile manner. A Pfannenstiel incision was made and carried down through the subcutaneous tissue to the fascia. Fascial incision was made and extended transversely using Mayo scissors. The fascia was separated from the underlying rectus tissue superiorly and inferiorly. The peritoneum was identified and entered. Peritoneal incision was extended longitudinally. The utero-vesical peritoneal reflection was incised transversely and the bladder flap was bluntly freed from the lower uterine segment. A low transverse uterine incision(Kerr hysterotomy) was made. Delivered from OA presentation was a  female with Apgar scores of 8 at one minute and 9 at five minutes. Bulb suctioning gently performed. Neonatal team in attendance.After  the umbilical cord was clamped and cut cord blood was obtained for evaluation. Cord ph from UA collected.The placenta was removed intact and appeared normal. The uterus was curetted with a dry lap pack. Good hemostasis was noted.The uterine outline, tubes and ovaries appeared normal. The uterine incision was closed with running locked sutures of 0 Monocryl x 2 layers.  Left lower segment extension noted and closed in 2 layers. Hemostasis was observed. Lavage was carried out until clear.The parietal peritoneum was closed with a running 2-0 Monocryl suture. The fascia was then reapproximated with running sutures of 0 Monocryl. The skin was reapproximated with 3-0 monocryl after [place] closure with 2-0 plain.  Instrument, sponge, and needle counts were correct prior the abdominal closure and at the conclusion of the case.   Findings: FTLM, malodorous AF, Nl uterus and nl adnexa  Estimated Blood Loss:  700         Drains: foley                 Specimens: placenta to path                 Complications:  None; patient tolerated the procedure well.         Disposition: PACU - hemodynamically stable.         Condition: stable  Attending Attestation: I performed the procedure.

## 2016-01-15 NOTE — Progress Notes (Signed)
Alicia Christensen is a 30 y.o. G2P0010 at 4769w0d by LMP admitted for induction of labor due to Gestational diabetes and Hypertension. Called to evaluated due to change in baseline with fetal tachycardia and variable decelerations.  Subjective: Feel pressure  Objective: BP 118/51 mmHg  Pulse 117  Temp(Src) 99.8 F (37.7 C) (Axillary)  Resp 18  Ht 5\' 5"  (1.651 m)  Wt 127.007 kg (280 lb)  BMI 46.59 kg/m2  SpO2 99%  LMP 04/17/2015 I/O last 3 completed shifts: In: -  Out: 900 [Urine:900]    FHT:  FHR: 180 bpm, variability: minimal ,  accelerations:  Abscent,  decelerations:  Present variables with contractions UC:   regular, every 2-3 minutes SVE:   Dilation: 10 Effacement (%): 100 Station: 0 Exam by:: Dr. Billy Coastaavon  Labs: Lab Results  Component Value Date   WBC 10.1 01/15/2016   HGB 10.2* 01/15/2016   HCT 31.8* 01/15/2016   MCV 80.1 01/15/2016   PLT 276 01/15/2016    Assessment / Plan: Fetal tachycardia-Category 2-3 tracing  Complete- second stage  Labor: Will begin with trial of pushing.  Preeclampsia:  no signs or symptoms of toxicity, intake and ouput balanced and labs stable Fetal Wellbeing:  Category II and Category III Pain Control:  Epidural I/D:  n/a Anticipated MOD:  Reassess possible need for csection in 15-20 min. Pt and husband aware of plan.  Alicia Christensen 01/15/2016, 9:31 PM

## 2016-01-15 NOTE — Progress Notes (Signed)
[facility]  Delivery Note:  C-section       01/15/2016  11:25 PM  I was called to the operating room at the request of the patient's obstetrician (Dr. Taavon) for a primary c-section for non-reassuring fetal heart tones.  PRENATAL HX:  This is a 29 y/o G2P0010 at 39 and 0/[redacted] weeks gestation who was admitted on 4/1 for IOL due to gestational diabetes and hypertension.  AROM at 1035 this morning (ROM ~ 12 hours).  Mother developed a fever to 101 and fetus became tachycardic with decreased HR variability so delivery was by c-section.  Mother did not receive antibiotics prior to delivery.    DELIVERY:  Infant was vigorous at delivery, requiring no resuscitation other than standard warming, drying and stimulation.  APGARs 8 and 9.  Exam notable for a breif period of retractions from 5-10 minutes of age, however this normalized and exam now within normal limits.  After 10 minutes, baby left with nurse to assist parents with skin-to-skin care.  Give risk for sepsis (maternal fever with no antibiotics), infant should be observed closely for signs and symptoms of sepsis.  However, as infant is well appearing, early onset sepsis risk per [facility] sepsis calculator is low.  Should there be any clinical concern such as increased work of breathing or sustained tachycardia, please contact the NICU attending as a sepsis evaluation to include antibiotic coverage may be warranted.    _____________________ Electronically Signed By: Razia Screws, MD Neonatologist 

## 2016-01-15 NOTE — Anesthesia Preprocedure Evaluation (Signed)
Anesthesia Evaluation  Patient identified by MRN, date of birth, ID band Patient awake    Reviewed: Allergy & Precautions, H&P , NPO status , Patient's Chart, lab work & pertinent test results  History of Anesthesia Complications Negative for: history of anesthetic complications  Airway Mallampati: II  TM Distance: >3 FB Neck ROM: full    Dental no notable dental hx. (+) Teeth Intact   Pulmonary neg pulmonary ROS,    Pulmonary exam normal breath sounds clear to auscultation       Cardiovascular negative cardio ROS Normal cardiovascular exam Rhythm:regular Rate:Normal     Neuro/Psych negative neurological ROS  negative psych ROS   GI/Hepatic negative GI ROS, Neg liver ROS,   Endo/Other  Morbid obesity  Renal/GU negative Renal ROS  negative genitourinary   Musculoskeletal   Abdominal   Peds  Hematology negative hematology ROS (+)   Anesthesia Other Findings   Reproductive/Obstetrics (+) Pregnancy                             Anesthesia Physical Anesthesia Plan  ASA: II  Anesthesia Plan: Epidural   Post-op Pain Management:    Induction:   Airway Management Planned:   Additional Equipment:   Intra-op Plan:   Post-operative Plan:   Informed Consent: I have reviewed the patients History and Physical, chart, labs and discussed the procedure including the risks, benefits and alternatives for the proposed anesthesia with the patient or authorized representative who has indicated his/her understanding and acceptance.     Plan Discussed with:   Anesthesia Plan Comments:         Anesthesia Quick Evaluation  

## 2016-01-15 NOTE — Progress Notes (Signed)
Patient ID: [name], female   DOB: Jun 20, 1986, 30 y.o.   MRN: [id number] Patient pushing well No change in exam FD/0 FHR 180-190 with minimal variability [facility] notified. Consent done.

## 2016-01-16 ENCOUNTER — Encounter (HOSPITAL_COMMUNITY): Payer: Self-pay

## 2016-01-16 LAB — CBC
HCT: 29 % — ABNORMAL LOW (ref 36.0–46.0)
Hemoglobin: 9.3 g/dL — ABNORMAL LOW (ref 12.0–15.0)
MCH: 25.6 pg — ABNORMAL LOW (ref 26.0–34.0)
MCHC: 32.1 g/dL (ref 30.0–36.0)
MCV: 79.9 fL (ref 78.0–100.0)
PLATELETS: 249 10*3/uL (ref 150–400)
RBC: 3.63 MIL/uL — AB (ref 3.87–5.11)
RDW: 17.3 % — ABNORMAL HIGH (ref 11.5–15.5)
WBC: 28.3 10*3/uL — AB (ref 4.0–10.5)

## 2016-01-16 LAB — GLUCOSE, CAPILLARY: GLUCOSE-CAPILLARY: 104 mg/dL — AB (ref 65–99)

## 2016-01-16 MED ORDER — ZOLPIDEM TARTRATE 5 MG PO TABS: 5.0000 mg | ORAL_TABLET | Freq: Every evening | ORAL | Status: DC | PRN

## 2016-01-16 MED ORDER — FAMOTIDINE 20 MG PO TABS
20.0000 mg | ORAL_TABLET | Freq: Every day | ORAL | Status: DC
Start: 2016-01-16 — End: 2016-01-18
  Filled 2016-01-16 (×3): qty 1

## 2016-01-16 MED ORDER — LORATADINE 10 MG PO TABS
10.0000 mg | ORAL_TABLET | Freq: Every day | ORAL | Status: DC
  Filled 2016-01-16 (×3): qty 1

## 2016-01-16 MED ORDER — ACETAMINOPHEN 325 MG PO TABS
ORAL_TABLET | ORAL | Status: AC
  Filled 2016-01-16: qty 2

## 2016-01-16 MED ORDER — PRENATAL MULTIVITAMIN CH
1.0000 | ORAL_TABLET | Freq: Every day | ORAL | Status: DC
  Administered 2016-01-16 – 2016-01-18 (×3): 1 via ORAL
  Filled 2016-01-16 (×3): qty 1

## 2016-01-16 MED ORDER — NALBUPHINE HCL 10 MG/ML IJ SOLN: 5.0000 mg | Freq: Once | INTRAMUSCULAR | Status: DC | PRN

## 2016-01-16 MED ORDER — LACTATED RINGERS IV SOLN: 2.5000 [IU]/h | INTRAVENOUS | Status: DC

## 2016-01-16 MED ORDER — POLYSACCHARIDE IRON COMPLEX 150 MG PO CAPS
150.0000 mg | ORAL_CAPSULE | Freq: Every day | ORAL | Status: DC
  Administered 2016-01-17 – 2016-01-18 (×2): 150 mg via ORAL
  Filled 2016-01-16 (×2): qty 1

## 2016-01-16 MED ORDER — NALBUPHINE HCL 10 MG/ML IJ SOLN: 5.0000 mg | INTRAMUSCULAR | Status: DC | PRN

## 2016-01-16 MED ORDER — OXYCODONE-ACETAMINOPHEN 5-325 MG PO TABS
1.0000 | ORAL_TABLET | ORAL | Status: DC | PRN
  Administered 2016-01-16 (×2): 1 via ORAL
  Filled 2016-01-16 (×2): qty 1

## 2016-01-16 MED ORDER — NALOXONE HCL 0.4 MG/ML IJ SOLN: 0.4000 mg | INTRAMUSCULAR | Status: DC | PRN

## 2016-01-16 MED ORDER — KETOROLAC TROMETHAMINE 30 MG/ML IJ SOLN
INTRAMUSCULAR | Status: AC
  Administered 2016-01-16: 30 mg via INTRAMUSCULAR
  Filled 2016-01-16: qty 1

## 2016-01-16 MED ORDER — ACETAMINOPHEN 10 MG/ML IV SOLN
1000.0000 mg | Freq: Once | INTRAVENOUS | Status: AC
  Administered 2016-01-16: 1000 mg via INTRAVENOUS
  Filled 2016-01-16: qty 100

## 2016-01-16 MED ORDER — NALBUPHINE HCL 10 MG/ML IJ SOLN
5.0000 mg | INTRAMUSCULAR | Status: DC | PRN
  Administered 2016-01-16: 5 mg via INTRAVENOUS
  Filled 2016-01-16: qty 1

## 2016-01-16 MED ORDER — LANOLIN HYDROUS EX OINT: 1.0000 "application " | TOPICAL_OINTMENT | CUTANEOUS | Status: DC | PRN

## 2016-01-16 MED ORDER — SCOPOLAMINE 1 MG/3DAYS TD PT72: 1.0000 | MEDICATED_PATCH | Freq: Once | TRANSDERMAL | Status: DC

## 2016-01-16 MED ORDER — LACTATED RINGERS IV SOLN
INTRAVENOUS | Status: DC
  Administered 2016-01-16 (×2): via INTRAVENOUS

## 2016-01-16 MED ORDER — DIBUCAINE 1 % RE OINT: 1.0000 "application " | TOPICAL_OINTMENT | RECTAL | Status: DC | PRN

## 2016-01-16 MED ORDER — ONDANSETRON HCL 4 MG/2ML IJ SOLN: 4.0000 mg | Freq: Three times a day (TID) | INTRAMUSCULAR | Status: DC | PRN

## 2016-01-16 MED ORDER — MENTHOL 3 MG MT LOZG: 1.0000 | LOZENGE | OROMUCOSAL | Status: DC | PRN

## 2016-01-16 MED ORDER — TETANUS-DIPHTH-ACELL PERTUSSIS 5-2.5-18.5 LF-MCG/0.5 IM SUSP
0.5000 mL | Freq: Once | INTRAMUSCULAR | Status: DC
Start: 2016-01-16 — End: 2016-01-16

## 2016-01-16 MED ORDER — SENNOSIDES-DOCUSATE SODIUM 8.6-50 MG PO TABS
2.0000 | ORAL_TABLET | ORAL | Status: DC
  Administered 2016-01-17 (×2): 2 via ORAL
  Filled 2016-01-16 (×2): qty 2

## 2016-01-16 MED ORDER — DIPHENHYDRAMINE HCL 25 MG PO CAPS: 25.0000 mg | ORAL_CAPSULE | ORAL | Status: DC | PRN

## 2016-01-16 MED ORDER — KETOROLAC TROMETHAMINE 30 MG/ML IJ SOLN: 30.0000 mg | Freq: Four times a day (QID) | INTRAMUSCULAR | Status: DC | PRN

## 2016-01-16 MED ORDER — METHYLERGONOVINE MALEATE 0.2 MG PO TABS: 0.2000 mg | ORAL_TABLET | ORAL | Status: DC | PRN

## 2016-01-16 MED ORDER — ACETAMINOPHEN 325 MG PO TABS: 650.0000 mg | ORAL_TABLET | ORAL | Status: DC | PRN

## 2016-01-16 MED ORDER — SIMETHICONE 80 MG PO CHEW: 80.0000 mg | CHEWABLE_TABLET | ORAL | Status: DC | PRN

## 2016-01-16 MED ORDER — SIMETHICONE 80 MG PO CHEW
80.0000 mg | CHEWABLE_TABLET | Freq: Three times a day (TID) | ORAL | Status: DC
  Administered 2016-01-16 – 2016-01-18 (×7): 80 mg via ORAL
  Filled 2016-01-16 (×7): qty 1

## 2016-01-16 MED ORDER — ONDANSETRON HCL 4 MG/2ML IJ SOLN
4.0000 mg | Freq: Once | INTRAMUSCULAR | Status: DC
Start: 2016-01-16 — End: 2016-01-16

## 2016-01-16 MED ORDER — SIMETHICONE 80 MG PO CHEW
80.0000 mg | CHEWABLE_TABLET | ORAL | Status: DC
  Administered 2016-01-17 (×2): 80 mg via ORAL
  Filled 2016-01-16 (×2): qty 1

## 2016-01-16 MED ORDER — SODIUM CHLORIDE 0.9% FLUSH: 3.0000 mL | INTRAVENOUS | Status: DC | PRN

## 2016-01-16 MED ORDER — DIPHENHYDRAMINE HCL 50 MG/ML IJ SOLN: 12.5000 mg | INTRAMUSCULAR | Status: DC | PRN

## 2016-01-16 MED ORDER — MAGNESIUM 200 MG PO TABS
200.0000 mg | ORAL_TABLET | Freq: Every day | ORAL | Status: DC
  Administered 2016-01-16 – 2016-01-18 (×3): 200 mg via ORAL
  Filled 2016-01-16 (×4): qty 1

## 2016-01-16 MED ORDER — FENTANYL CITRATE (PF) 100 MCG/2ML IJ SOLN: 25.0000 ug | INTRAMUSCULAR | Status: DC | PRN

## 2016-01-16 MED ORDER — LACTATED RINGERS IV SOLN: INTRAVENOUS | Status: DC

## 2016-01-16 MED ORDER — NALOXONE HCL 2 MG/2ML IJ SOSY: 1.0000 ug/kg/h | PREFILLED_SYRINGE | INTRAVENOUS | Status: DC | PRN

## 2016-01-16 MED ORDER — DIPHENHYDRAMINE HCL 25 MG PO CAPS: 25.0000 mg | ORAL_CAPSULE | Freq: Four times a day (QID) | ORAL | Status: DC | PRN

## 2016-01-16 MED ORDER — KETOROLAC TROMETHAMINE 30 MG/ML IJ SOLN
30.0000 mg | Freq: Four times a day (QID) | INTRAMUSCULAR | Status: DC | PRN
  Administered 2016-01-16: 30 mg via INTRAMUSCULAR

## 2016-01-16 MED ORDER — WITCH HAZEL-GLYCERIN EX PADS: 1.0000 "application " | MEDICATED_PAD | CUTANEOUS | Status: DC | PRN

## 2016-01-16 MED ORDER — OXYCODONE-ACETAMINOPHEN 5-325 MG PO TABS
2.0000 | ORAL_TABLET | ORAL | Status: DC | PRN
  Administered 2016-01-16 – 2016-01-18 (×9): 2 via ORAL
  Filled 2016-01-16 (×9): qty 2

## 2016-01-16 MED ORDER — MEPERIDINE HCL 25 MG/ML IJ SOLN: 6.2500 mg | INTRAMUSCULAR | Status: DC | PRN

## 2016-01-16 MED ORDER — IBUPROFEN 600 MG PO TABS
600.0000 mg | ORAL_TABLET | Freq: Four times a day (QID) | ORAL | Status: DC
  Administered 2016-01-16 – 2016-01-18 (×9): 600 mg via ORAL
  Filled 2016-01-16 (×9): qty 1

## 2016-01-16 MED ORDER — METHYLERGONOVINE MALEATE 0.2 MG/ML IJ SOLN: 0.2000 mg | INTRAMUSCULAR | Status: DC | PRN

## 2016-01-16 NOTE — Lactation Note (Signed)
This note was copied from a baby's chart. Lactation Consultation Note Sleepy baby wouldn't latch. Weak suck on gloved finger. Suck training and stimulation to suck. Once got sucking, hand pump everted nipple to evert. Everted some, rolled nipples in finger tips, attempted to latch baby in football hold. Baby arches back wouldn't latch. Needed to keep stimulating nipple to evert. Fitted mom for #20 NS. Next feeding will try to latch using NS. Gave 12ml Similac d/t mom GDM and baby has BF yet. Mom doing STS. Reported to Nursery and Charity fundraiserN. Patient Name: Alicia Mady Haagensenllison Prest WUJWJ'XToday's Date: 01/16/2016 Reason for consult: Follow-up assessment;Difficult latch   Maternal Data Has patient been taught Hand Expression?: Yes Does the patient have breastfeeding experience prior to this delivery?: No  Feeding Feeding Type: Formula Nipple Type: Slow - flow Length of feed: 0 min  LATCH Score/Interventions Latch: Too sleepy or reluctant, no latch achieved, no sucking elicited. Intervention(s): Skin to skin;Teach feeding cues;Waking techniques  Audible Swallowing: None Intervention(s): Hand expression;Skin to skin  Type of Nipple: Inverted Intervention(s): Shells;Hand pump  Comfort (Breast/Nipple): Soft / non-tender     Hold (Positioning): Full assist, staff holds infant at breast Intervention(s): Breastfeeding basics reviewed;Support Pillows;Position options;Skin to skin  LATCH Score: 2  Lactation Tools Discussed/Used Tools: Shells;Nipple Dorris CarnesShields;Pump Nipple shield size: 20 Shell Type: Inverted Breast pump type: Manual Pump Review: Setup, frequency, and cleaning;Milk Storage Initiated by:: Peri JeffersonL. Estuardo Frisbee RN Date initiated:: 01/16/16   Consult Status Consult Status: Follow-up Date: 01/16/16 Follow-up type: In-patient    Alicia Christensen, Alicia Christensen 01/16/2016, 4:42 AM

## 2016-01-16 NOTE — Lactation Note (Signed)
This note was copied from a baby's chart. Lactation Consultation Note  Patient Name: [name] WUJWJ'XToday's Date: 01/16/2016 Reason for consult: Follow-up assessment  With this mom and term baby, now 8914 hours old. Mom has been formula feeding, and I asked her if she wanted to pump wh DEP. Mom said she will think about it,  And is not sure she wants to provide Breast milk. I told mom to call me if she wants to pump or help with latch.  Maternal Data    Feeding Feeding Type: Bottle Fed - Formula Nipple Type: Slow - flow  LATCH Score/Interventions                      Lactation Tools Discussed/Used     Consult Status Consult Status: Follow-up Date: 01/17/16 Follow-up type: In-patient    Alicia Christensen, [name] 01/16/2016, 1:27 PM

## 2016-01-16 NOTE — Anesthesia Postprocedure Evaluation (Signed)
Anesthesia Post Note  Patient: Alicia Christensen  Procedure(s) Performed: Procedure(s) (LRB): CESAREAN SECTION (N/A)  Patient location during evaluation: PACU Anesthesia Type: Epidural Level of consciousness: oriented and awake and alert Pain management: pain level controlled Vital Signs Assessment: post-procedure vital signs reviewed and stable Respiratory status: spontaneous breathing, respiratory function stable and patient connected to nasal cannula oxygen Cardiovascular status: blood pressure returned to baseline and stable Postop Assessment: no headache, no backache and epidural receding Anesthetic complications: no    Last Vitals:  Filed Vitals:   01/16/16 0001 01/16/16 0016  BP: 116/66   Pulse: 124   Temp:  38.4 C  Resp: 29     Last Pain:  Filed Vitals:   01/16/16 0020  PainSc: 5                  Phillips Groutarignan, [name]

## 2016-01-16 NOTE — Anesthesia Postprocedure Evaluation (Signed)
Anesthesia Post Note  Patient: Alicia Christensen  Procedure(s) Performed: Procedure(s) (LRB): CESAREAN SECTION (N/A)  Patient location during evaluation: Mother Baby Anesthesia Type: Epidural Level of consciousness: awake and alert Pain management: pain level controlled Vital Signs Assessment: post-procedure vital signs reviewed and stable Respiratory status: spontaneous breathing, nonlabored ventilation and respiratory function stable Cardiovascular status: stable Postop Assessment: no headache, no backache and epidural receding Anesthetic complications: no    Last Vitals:  Filed Vitals:   01/16/16 0341 01/16/16 0510  BP: 117/78   Pulse: 95   Temp: 36.9 C 37.6 C  Resp: 20 20    Last Pain:  Filed Vitals:   01/16/16 0613  PainSc: 0-No pain                 Aneri Slagel

## 2016-01-16 NOTE — Progress Notes (Signed)
POSTOPERATIVE DAY # 1 S/P CS - NRFHR   S:         Reports feeling well                    coughing (cough throughout pregnancy - saw pulmologist) some with sore throat             Tolerating po intake / no nausea / no vomiting / no flatus / no BM             Bleeding is light             Pain controlled withnarcotic from surgery - some itching reported             Up ad lib / ambulatory/ voiding QS  Newborn breast feeding  / Circumcision planned  O:  VS: BP 121/66 mmHg  Pulse 101  Temp(Src) 99 F (37.2 C) (Oral)  Resp 20  Ht 5\' 5"  (1.651 m)  Wt 127.007 kg (280 lb)  BMI 46.59 kg/m2  SpO2 96%  LMP 04/17/2015   LABS:               Recent Labs  01/15/16 0045 01/16/16 0514  WBC 10.1 28.3*  HGB 10.2* 9.3*  PLT 276 249               Bloodtype: --/--/O POS, O POS (04/02 0045)  Rubella: Immune (08/31 0000)                                             I&O: - 425  With urine dark amber in foley bag             Physical Exam:             Alert and Oriented X3  Lungs: Clear inspiratory but mild wheeze noted at end of expiration / unlabored  Heart: regular rate and rhythm / no mumurs  Abdomen: soft, non-tender, non-distended, hypoactive BS             Fundus: firm, non-tender, Ueven             Dressing intact pressure dressing             Perineum: no edema  Lochia: light  Extremities: trace edema, no calf pain or tenderness, SCD in place  A:        POD # 1 S/P CS for NRFHR (fetal tachycardia) and arrest of descent            IDA with ABL anemia - stable            Chronic cough with known allergies  P:        Routine postoperative care              Iron and magnesium             Claritin & Zantac for allergies / encourage deep breaths & ambulation / increase water intake to avoid excess IVF              Warm fluids and Cepacol for sore throat    Marlinda MikeBAILEY, Orra Nolde CNM, MSN, FACNM 01/16/2016, 9:22 AM

## 2016-01-16 NOTE — Addendum Note (Signed)
Addendum  created 01/16/16 0737 by Junious SilkMelinda Darcell Yacoub, CRNA   Modules edited: Charges VN, Clinical Notes   Clinical Notes:  File: [id number]

## 2016-01-16 NOTE — Lactation Note (Addendum)
This note was copied from a baby's chart. Lactation Consultation Note New mom exhausted d/t long labor, pushing, then having a C-section. Mom running high fever. Mom isn't all in favor of BF. States she will try it, then states she don't think she will produce milk, had no changes in breast during pregnancy. Mom GDM on glyburide. Baby STS after delivery, bld sugar 70. Noted baby has upper labial frenulum, thick to top lip, probable anterior tongue. Cups tongue. Has recessed chin. Mom has inverted nipples that everts out w/stimulation, then inverts back. Discussed pros of BF and possible challenges, that LC would help mom BF if that is what she chooses to do. Mom encouraged to feed baby 8-12 times/24 hours and with feeding cues. Mom encouraged to waken baby for feeds.  Referred to Baby and Me Book in Breastfeeding section Pg. 22-23 for position options and Proper latch demonstration. Educated about newborn behavior, STS, I&O, supply and demand. [facility] brochure given w/resources, support groups and LC services. Mom and RN know to call LC to assist in BF.  Patient Name: Alicia Christensen ZOXWR'UToday's Date: 01/16/2016 Reason for consult: Initial assessment   Maternal Data Has patient been taught Hand Expression?: Yes Does the patient have breastfeeding experience prior to this delivery?: No  Feeding    LATCH Score/Interventions       Type of Nipple: Inverted              Lactation Tools Discussed/Used     Consult Status Consult Status: Follow-up Date: 01/16/16 Follow-up type: In-patient    Vicktoria Muckey, Diamond NickelLAURA G 01/16/2016, 2:29 AM

## 2016-01-17 NOTE — Progress Notes (Signed)
Patient ID: Alicia Christensen, female   DOB: Mar 13, 1986, 30 y.o.   MRN: [id number] Subjective: S/P [facility] Information for the patient's newborn:  Alicia Christensen, [name] [[id number]][[id number]]  female  / circ done  Reports feeling a little sore, but well. Feeding: breast Patient reports tolerating PO.  Breast symptoms: none Pain controlled with ibuprofen (OTC) and narcotic analgesics including Percocet Denies HA/SOB/C/P/N/V/dizziness. Flatus present. No BM. She reports vaginal bleeding as normal, without clots.  She is ambulating, urinating without difficult.     Objective:   VS:  Filed Vitals:   01/16/16 1220 01/16/16 1730 01/16/16 2155 01/17/16 0554  BP: 128/78 124/76 123/61 113/53  Pulse: 105 92 94 93  Temp: 99.2 F (37.3 C) 98.2 F (36.8 C) 98.1 F (36.7 C) 97.4 F (36.3 C)  TempSrc: Oral Oral Oral Oral  Resp: 20 18 18 18   Height:      Weight:      SpO2:  95% 99%      Intake/Output Summary (Last 24 hours) at 01/17/16 0939 Last data filed at 01/16/16 1829  Gross per 24 hour  Intake   1000 ml  Output   1450 ml  Net   -450 ml        Recent Labs  01/15/16 0045 01/16/16 0514  WBC 10.1 28.3*  HGB 10.2* 9.3*  HCT 31.8* 29.0*  PLT 276 249     Blood type: O POS (04/02 0045)  Rubella: Immune (08/31 0000)     Physical Exam:   General: alert, cooperative, no distress and morbidly obese  Abdomen: soft, nontender, normal bowel sounds  Incision: dried serous drainage present in middle of Honeycomb dressing D/I - skin approximated with sutures  Uterine Fundus: firm, 1 FB below umbilicus, nontender  Lochia: minimal  Ext: extremities normal, atraumatic, no cyanosis or edema and Homans sign is negative, no sign of DVT   Assessment/Plan: 30 y.o.   POD# 2.  S/P Cesarean Delivery.  Indications: NRFHR                Principal Problem:   Postpartum care following cesarean delivery (4/2) Active Problems:   Diabetes in undelivered pregnancy  Doing  well, stable.               Regular diet as tolerated Ambulate TID in hallways today Routine post-op care Anticipate D/C home tomorrow  Raelyn MoraAWSON, Nikka Hakimian, M, MSN, CNM 01/17/2016, 9:39 AM

## 2016-01-18 MED ORDER — FAMOTIDINE 20 MG PO TABS: 20.0000 mg | ORAL_TABLET | Freq: Every day | ORAL | Status: DC

## 2016-01-18 MED ORDER — IBUPROFEN 600 MG PO TABS: 600.0000 mg | ORAL_TABLET | Freq: Four times a day (QID) | ORAL | Status: DC

## 2016-01-18 MED ORDER — LORATADINE 10 MG PO TABS: 10.0000 mg | ORAL_TABLET | Freq: Every day | ORAL | Status: DC

## 2016-01-18 MED ORDER — OXYCODONE-ACETAMINOPHEN 5-325 MG PO TABS: 1.0000 | ORAL_TABLET | ORAL | Status: DC | PRN

## 2016-01-18 MED ORDER — POLYSACCHARIDE IRON COMPLEX 150 MG PO CAPS: 150.0000 mg | ORAL_CAPSULE | Freq: Every day | ORAL | Status: DC

## 2016-01-18 MED ORDER — MAGNESIUM 200 MG PO TABS: 200.0000 mg | ORAL_TABLET | Freq: Every day | ORAL | Status: DC

## 2016-01-18 NOTE — Lactation Note (Signed)
This note was copied from a baby's chart. Lactation Consultation Note Rn, laura reports mom is all bottle feeding formula.  Patient Name: Alicia Christensen BJYNW'GToday's Date: 01/18/2016     Maternal Data    Feeding Feeding Type: Bottle Fed - Formula Nipple Type: Slow - flow  LATCH Score/Interventions                      Lactation Tools Discussed/Used     Consult Status      [name], Arvella MerlesJana Lynn 01/18/2016, 8:29 AM

## 2016-01-18 NOTE — Progress Notes (Signed)
POSTOPERATIVE DAY # 3 S/P CS  S:         Reports feeling ok             Tolerating po intake / no nausea / no vomiting / + flatus / + BM             Bleeding is light             Pain controlled with motrin mostly             Up ad lib / ambulatory/ voiding QS  Newborn breast feeding  / Circumcision done   O:  VS: BP 137/84 mmHg  Pulse 88  Temp(Src) 98 F (36.7 C) (Oral)  Resp 20  Ht 5\' 5"  (1.651 m)  Wt 127.007 kg (280 lb)  BMI 46.59 kg/m2  SpO2 99%  LMP 04/17/2015  Breastfeeding? Unknown   LABS:               Recent Labs  01/16/16 0514  WBC 28.3*  HGB 9.3*  PLT 249               Bloodtype: --/--/O POS, O POS (04/02 0045)  Rubella: Immune (08/31 0000)                                 Physical Exam:             Alert and Oriented X3  Lungs: Clear and unlabored / improved / persistent cough  Heart: regular rate and rhythm / no mumurs  Abdomen: soft, non-tender, non-distended             Fundus: firm, non-tender, Ueven             Dressing intact              Incision:  approximated with suture / no erythema / no ecchymosis / no drainage  Lochia: light  Extremities: 1+ edema, no calf pain or tenderness, neg Homans  A:        POD # 3 S/P CS            Mild ABL anemia  P:        Routine postoperative care              DC home - [facility] booklet - instructions reviewed     Marlinda MikeBAILEY, TANYA CNM, MSN, FACNM 01/18/2016, 8:01 AM

## 2016-01-18 NOTE — Discharge Summary (Signed)
POSTOPERATIVE DISCHARGE SUMMARY:  Patient ID: Alicia Christensen MRN: [id number] DOB/AGE: [birth year] 30 y.o.  Admit date: 01/15/2016 Admission Diagnoses: 39 weeks / obesity / GDMa2 / gestational hypertension  Discharge date:   Discharge Diagnoses: POD 3 s/p cesarean section for [facility]  Prenatal history: G2P1011   EDC : 01/22/2016, Alternate EDD Entry  [facility]  Primary provider : Taavon Prenatal course complicated by obesity / GDMa2 / gestational hypertension  Prenatal Labs: ABO, Rh: --/--/O POS, O POS (04/02 0045)  Antibody: NEG (04/02 0045) Rubella: Immune (08/31 0000)   RPR: Non Reactive (04/02 0045)  HBsAg: Negative (08/31 0000)  HIV: Non-reactive (08/31 0000)  GBS: Negative (03/09 0000)   Medical / Surgical History :  Past medical history:  Past Medical History  Diagnosis Date  . Frequent headaches   . Migraine   . Seasonal allergies   . Environmental allergies   . Hx of varicella   . Vaginal Pap smear, abnormal     Past surgical history:  Past Surgical History  Procedure Laterality Date  . Wisdom tooth extraction    . Dilation and curettage of uterus    . Cesarean section N/A 01/15/2016    Procedure: CESAREAN SECTION;  Surgeon: Olivia Mackieichard Taavon, MD;  Location: [facility];  Service: Obstetrics;  Laterality: N/A;    Family History:  Family History  Problem Relation Age of Onset  . Alcoholism Father     Resolved-Living  . Diabetes Mother     Hulda MarinLiivng  . Hyperlipidemia Mother   . Varicose Veins Mother   . Diabetes Paternal Grandfather   . Heart attack Paternal Grandfather   . Heart disease Paternal Aunt   . Hypertension Maternal Grandmother   . Heart attack Maternal Grandfather     Social History:  reports that she has never smoked. She has never used smokeless tobacco. She reports that she drinks alcohol. She reports that she does not use illicit drugs.  Allergies: Amoxicillin; Hydrocodeine; and Penicillins   Current  Medications at time of admission:  Prior to Admission medications   Medication Sig Start Date End Date Taking? Authorizing Provider  Multiple Vitamins-Minerals (AIRBORNE GUMMIES) CHEW Chew by mouth daily as needed.   Yes Historical Provider, MD  zolpidem (AMBIEN CR) 6.25 MG CR tablet Take 6.25 mg by mouth at bedtime as needed for sleep.   Yes Historical Provider, MD  famotidine (PEPCID) 20 MG tablet Take 1 tablet (20 mg total) by mouth daily. 01/18/16   Marlinda Mikeanya Bailey, CNM  fluticasone (FLONASE) 50 MCG/ACT nasal spray One twice daily each nostril Patient taking differently: Place 1 spray into the nose 2 (two) times daily. One twice daily each nostril 09/23/15   Nyoka CowdenMichael B Wert, MD    Intrapartum Course:  Admit for induction labor with labor progression to complete dilation with normal labor curve Pain management: epidural Complicated by: NRFHR Interventions required: cesarean section  Procedures: Cesarean section delivery on 01/15/2016 with delivery of viable female newborn by Dr Billy Coastaavon   See operative report for further details APGAR (1 MIN): 8   APGAR (5 MINS): 9    Postoperative / postpartum course:  Uncomplicated with discharge on POD 3   Discharge Instructions:  Discharged Condition: stable  Activity: pelvic rest and postoperative restrictions x 2   Diet: recommend low carb and low salt  Medications:    Medication List    TAKE these medications        AIRBORNE GUMMIES Chew  Chew by mouth  daily as needed.     famotidine 20 MG tablet  Commonly known as:  PEPCID  Take 1 tablet (20 mg total) by mouth daily.     fluticasone 50 MCG/ACT nasal spray  Commonly known as:  FLONASE  One twice daily each nostril     ibuprofen 600 MG tablet  Commonly known as:  ADVIL,MOTRIN  Take 1 tablet (600 mg total) by mouth every 6 (six) hours.     iron polysaccharides 150 MG capsule  Commonly known as:  NIFEREX  Take 1 capsule (150 mg total) by mouth daily.     loratadine 10 MG tablet   Commonly known as:  CLARITIN  Take 1 tablet (10 mg total) by mouth daily.     Magnesium 200 MG Tabs  Take 1 tablet (200 mg total) by mouth daily.     oxyCODONE-acetaminophen 5-325 MG tablet  Commonly known as:  PERCOCET/ROXICET  Take 1 tablet by mouth every 4 (four) hours as needed (pain scale 4-7).     zolpidem 6.25 MG CR tablet  Commonly known as:  AMBIEN CR  Take 6.25 mg by mouth at bedtime as needed for sleep.        Wound Care: keep clean and dry / remove honeycomb POD 6 Postpartum Instructions: [facility] discharge booklet - instructions reviewed  Discharge to: Home  Follow up :  [facility] in 1 week for interval visit with nurse for Blood pressure check [facility] in 6 weeks for routine postpartum visit with Dr Leone Brand test 6-12 weeks postpartum                Signed: Marlinda Mike CNM, MSN, [facility] 01/18/2016, 8:16 AM

## 2016-03-08 ENCOUNTER — Telehealth: Payer: BC Managed Care – PPO | Admitting: Physician Assistant

## 2016-03-08 DIAGNOSIS — J208 Acute bronchitis due to other specified organisms: Principal | ICD-10-CM

## 2016-03-08 DIAGNOSIS — J00 Acute nasopharyngitis [common cold]: Secondary | ICD-10-CM

## 2016-03-08 DIAGNOSIS — B9689 Other specified bacterial agents as the cause of diseases classified elsewhere: Secondary | ICD-10-CM

## 2016-03-08 MED ORDER — AZITHROMYCIN 250 MG PO TABS: ORAL_TABLET | ORAL | Status: DC

## 2016-03-08 NOTE — Progress Notes (Signed)
We are sorry that you are not feeling well.  Here is how we plan to help!  Based on what you have shared with me it looks like you have upper respiratory tract inflammation that has resulted in a significant cough.  Inflammation and infection in the upper respiratory tract is commonly called bronchitis and has four common causes:  Allergies, Viral Infections, Acid Reflux and Bacterial Infections.  Allergies, viruses and acid reflux are treated by controlling symptoms or eliminating the cause. An example might be a cough caused by taking certain blood pressure medications. You stop the cough by changing the medication. Another example might be a cough caused by acid reflux. Controlling the reflux helps control the cough.  Based on your presentation I believe you most likely have A cough due to bacteria.  When patients have a fever and a productive cough with a change in color or increased sputum production, we are concerned about bacterial bronchitis.  If left untreated it can progress to pneumonia.  If your symptoms do not improve with your treatment plan it is important that you contact your provider.   I have prescribed Azithromyin 250 mg: two tables now and then one tablet daily for 4 additonal days   In addition you may use A non-prescription cough medication called Mucinex DM: take 2 tablets every 12 hours.   If you are breast feeding you will need to "pump and dump" and switch to formula as the antibiotic is excreted in the breast milk.    Since I am your PCP Revonda StandardAllison, I want you to come see me in office if symptoms are not taking a turn for the better within 48 hours, including fever. Also if the vomiting persists, you need to see me tomorrow in office or go to an Urgent Care or ER for assessment due to concern for dehydration.    HOME CARE . Only take medications as instructed by your medical team. . Complete the entire course of an antibiotic. . Drink plenty of fluids and get plenty of  rest. . Avoid close contacts especially the very young and the elderly . Cover your mouth if you cough or cough into your sleeve. . Always remember to wash your hands . A steam or ultrasonic humidifier can help congestion.    GET HELP RIGHT AWAY IF: . You develop worsening fever. . You become short of breath . You cough up blood. . Your symptoms persist after you have completed your treatment plan MAKE SURE YOU   Understand these instructions.  Will watch your condition.  Will get help right away if you are not doing well or get worse.  Your e-visit answers were reviewed by a board certified advanced clinical practitioner to complete your personal care plan.  Depending on the condition, your plan could have included both over the counter or prescription medications. If there is a problem please reply  once you have received a response from your provider. Your safety is important to us.  If you have drug allergies check your prescription carefully.    You can use MyChart to ask questions about today's visit, request a non-urgent call back, or ask for a work or school excuse for 24 hours related to this e-Visit. If it has been greater than 24 hours you will need to follow up with your provider, or enter a new e-Visit to address those concerns. You will get an e-mail in the next two days asking about your experience.  I hope  that your e-visit has been valuable and will speed your recovery. Thank you for using e-visits.   

## 2016-05-04 ENCOUNTER — Ambulatory Visit (INDEPENDENT_AMBULATORY_CARE_PROVIDER_SITE_OTHER): Payer: BC Managed Care – PPO | Admitting: Physician Assistant

## 2016-05-04 ENCOUNTER — Encounter: Payer: Self-pay | Admitting: Physician Assistant

## 2016-05-04 VITALS — BP 138/85 | HR 93 | Temp 98.2°F | Resp 16 | Ht 65.0 in | Wt 278.1 lb

## 2016-05-04 DIAGNOSIS — Z021 Encounter for pre-employment examination: Secondary | ICD-10-CM

## 2016-05-05 NOTE — Progress Notes (Signed)
Patient presents to clinic today for pre-employment examination. Patient has forms needing to be filled out. 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. Patient does not have immunization records with her. Does have TB skin test result report from recent screening. Wears glasses. Denies use of hearing aids or hearing difficulty. Denies history of medical condition that would impair work performance or ability to be responsible for young children.  Past Medical History  Diagnosis Date  . Frequent headaches   . Migraine   . Seasonal allergies   . Environmental allergies   . Hx of varicella   . Vaginal Pap smear, abnormal     Current Outpatient Prescriptions on File Prior to Visit  Medication Sig Dispense Refill  . fluticasone (FLONASE) 50 MCG/ACT nasal spray One twice daily each nostril (Patient taking differently: Place 1 spray into the nose 2 (two) times daily. One twice daily each nostril)    . ibuprofen (ADVIL,MOTRIN) 600 MG tablet Take 1 tablet (600 mg total) by mouth every 6 (six) hours. (Patient taking differently: Take 600 mg by mouth every 6 (six) hours as needed. ) 30 tablet 0  . loratadine (CLARITIN) 10 MG tablet Take 1 tablet (10 mg total) by mouth daily. 30 tablet 0  . zolpidem (AMBIEN CR) 6.25 MG CR tablet Take 6.25 mg by mouth at bedtime as needed for sleep.     No current facility-administered medications on file prior to visit.    Allergies  Allergen Reactions  . Amoxicillin Nausea And Vomiting  . Hydrocodeine [Dihydrocodeine] Nausea And Vomiting  . Penicillins Nausea And Vomiting    Has patient had a PCN reaction causing immediate rash, facial/tongue/throat swelling, SOB or lightheadedness with hypotension: No Has patient had a PCN reaction causing severe rash involving mucus membranes or skin necrosis: No Has patient had a PCN reaction that required hospitalization  No Has patient had a PCN reaction occurring within the last 10 years: No If all of the above answers are "NO", then may proceed with Cephalosporin use.     Family History  Problem Relation Age of Onset  . Alcoholism Father     Resolved-Living  . Diabetes Mother     Hulda Marin  . Hyperlipidemia Mother   . Varicose Veins Mother   . Diabetes Paternal Grandfather   . Heart attack Paternal Grandfather   . Heart disease Paternal Aunt   . Hypertension Maternal Grandmother   . Heart attack Maternal Grandfather     Social History   Social History  . Marital Status: Married    Spouse Name: N/A  . Number of Children: N/A  . Years of Education: N/A   Social History Main Topics  . Smoking status: Never Smoker   . Smokeless tobacco: Never Used  . Alcohol Use: 0.0 oz/week    0 Standard drinks or equivalent per week     Comment: rare -- 3 x month  . Drug Use: No  . Sexual Activity:    Partners: Male   Other Topics Concern  . None   Social History Narrative    Review of Systems - See HPI.  All other ROS are negative.  BP 138/85 mmHg  Pulse 93  Temp(Src) 98.2 F (36.8 C) (Oral)  Resp 16  Ht  (1.651 m)  Wt 278 lb 2 oz (126.157 kg)  BMI 46.28 kg/m2  SpO2 98%  LMP 04/25/2016  Physical  Exam  Constitutional: She is oriented to person, place, and time and well-developed, well-nourished, and in no distress.  HENT:  Head: Normocephalic and atraumatic.  Right Ear: External ear normal.  Left Ear: External ear normal.  Nose: Nose normal.  Mouth/Throat: Oropharynx is clear and moist. No oropharyngeal exudate.  Eyes: Conjunctivae and EOM are normal. Pupils are equal, round, and reactive to light.  Neck: Neck supple.  Cardiovascular: Normal rate, regular rhythm, normal heart sounds and intact distal pulses.   Pulmonary/Chest: Effort normal and breath sounds normal. No respiratory distress. She has no wheezes. She has no rales. She exhibits no tenderness.  Musculoskeletal:  Normal range of motion.  Neurological: She is alert and oriented to person, place, and time. No cranial nerve deficit.  Skin: Skin is warm and dry. No rash noted.  Psychiatric: Affect normal.  Vitals reviewed.   No results found for this or any previous visit (from the past 2160 hour(s)).  Assessment/Plan: 1. Encounter for pre-employment examination Examination within normal limits. Recent TB screen reviewed - negative with 0 mm induration. Forms have been filled out for patient with recommendation she bring in old immunization records for review so we can make sure she is up-to-date on all required immunizations.   Piedad Climes, PA-C

## 2016-05-11 ENCOUNTER — Encounter: Payer: Self-pay | Admitting: Physician Assistant

## 2016-05-11 ENCOUNTER — Other Ambulatory Visit: Payer: Self-pay | Admitting: Physician Assistant

## 2016-05-11 MED ORDER — PROMETHAZINE-DM 6.25-15 MG/5ML PO SYRP: 5.0000 mL | ORAL_SOLUTION | Freq: Four times a day (QID) | ORAL | 0 refills | Status: DC | PRN

## 2016-06-13 ENCOUNTER — Encounter: Payer: Self-pay | Admitting: Physician Assistant

## 2016-06-15 ENCOUNTER — Ambulatory Visit: Payer: Self-pay | Admitting: Physician Assistant

## 2016-09-20 ENCOUNTER — Telehealth: Payer: BC Managed Care – PPO | Admitting: Physician Assistant

## 2016-09-20 DIAGNOSIS — J209 Acute bronchitis, unspecified: Secondary | ICD-10-CM

## 2016-09-20 MED ORDER — DOXYCYCLINE HYCLATE 100 MG PO CAPS: 100.0000 mg | ORAL_CAPSULE | Freq: Two times a day (BID) | ORAL | 0 refills | Status: DC

## 2016-09-20 NOTE — Progress Notes (Signed)
We are sorry that you are not feeling well.  Here is how we plan to help!  Based on what you have shared with me it looks like you have upper respiratory tract inflammation that has resulted in a significant cough.  Inflammation and infection in the upper respiratory tract is commonly called bronchitis and has four common causes:  Allergies, Viral Infections, Acid Reflux and Bacterial Infections.  Allergies, viruses and acid reflux are treated by controlling symptoms or eliminating the cause. An example might be a cough caused by taking certain blood pressure medications. You stop the cough by changing the medication. Another example might be a cough caused by acid reflux. Controlling the reflux helps control the cough.  Based on your presentation I believe you most likely have A cough due to bacteria.  When patients have a fever and a productive cough with a change in color or increased sputum production, we are concerned about bacterial bronchitis.  If left untreated it can progress to pneumonia.  If your symptoms do not improve with your treatment plan it is important that you contact your provider. I know you are not currently pregnant but if you are breastfeeding you will want to give your baby formula, etc if they are still bottle only. You will want to dump breast milk while on medication to prevent exposure to baby.    I have prescribed Doxycycline 100 mg twice a day for 7 days     In addition you may use A non-prescription cough medication called Robitussin DAC. Take 2 teaspoons every 8 hours or Delsym: take 2 teaspoons every 12 hours.  USE OF BRONCHODILATOR ("RESCUE") INHALERS: There is a risk from using your bronchodilator too frequently.  The risk is that over-reliance on a medication which only relaxes the muscles surrounding the breathing tubes can reduce the effectiveness of medications prescribed to reduce swelling and congestion of the tubes themselves.  Although you feel brief relief  from the bronchodilator inhaler, your asthma may actually be worsening with the tubes becoming more swollen and filled with mucus.  This can delay other crucial treatments, such as oral steroid medications. If you need to use a bronchodilator inhaler daily, several times per day, you should discuss this with your provider.  There are probably better treatments that could be used to keep your asthma under control.     HOME CARE . Only take medications as instructed by your medical team. . Complete the entire course of an antibiotic. . Drink plenty of fluids and get plenty of rest. . Avoid close contacts especially the very young and the elderly . Cover your mouth if you cough or cough into your sleeve. . Always remember to wash your hands . A steam or ultrasonic humidifier can help congestion.   GET HELP RIGHT AWAY IF: . You develop worsening fever. . You become short of breath . You cough up blood. . Your symptoms persist after you have completed your treatment plan MAKE SURE YOU   Understand these instructions.  Will watch your condition.  Will get help right away if you are not doing well or get worse.  Your e-visit answers were reviewed by a board certified advanced clinical practitioner to complete your personal care plan.  Depending on the condition, your plan could have included both over the counter or prescription medications. If there is a problem please reply  once you have received a response from your provider. Your safety is important to us.  If you  have drug allergies check your prescription carefully.    You can use MyChart to ask questions about today's visit, request a non-urgent call back, or ask for a work or school excuse for 24 hours related to this e-Visit. If it has been greater than 24 hours you will need to follow up with your provider, or enter a new e-Visit to address those concerns. You will get an e-mail in the next two days asking about your experience.  I  hope that your e-visit has been valuable and will speed your recovery. Thank you for using e-visits.

## 2016-11-12 IMAGING — CR DG ANKLE COMPLETE 3+V*R*
3 series · 3 of 3 positions shown · non-contrast
Comparison: None.

CLINICAL DATA: Right lateral ankle pain and swelling 2 months. No
injury.

EXAM:
RIGHT ANKLE - COMPLETE 3+ VIEW

[t ankle joint ap right]
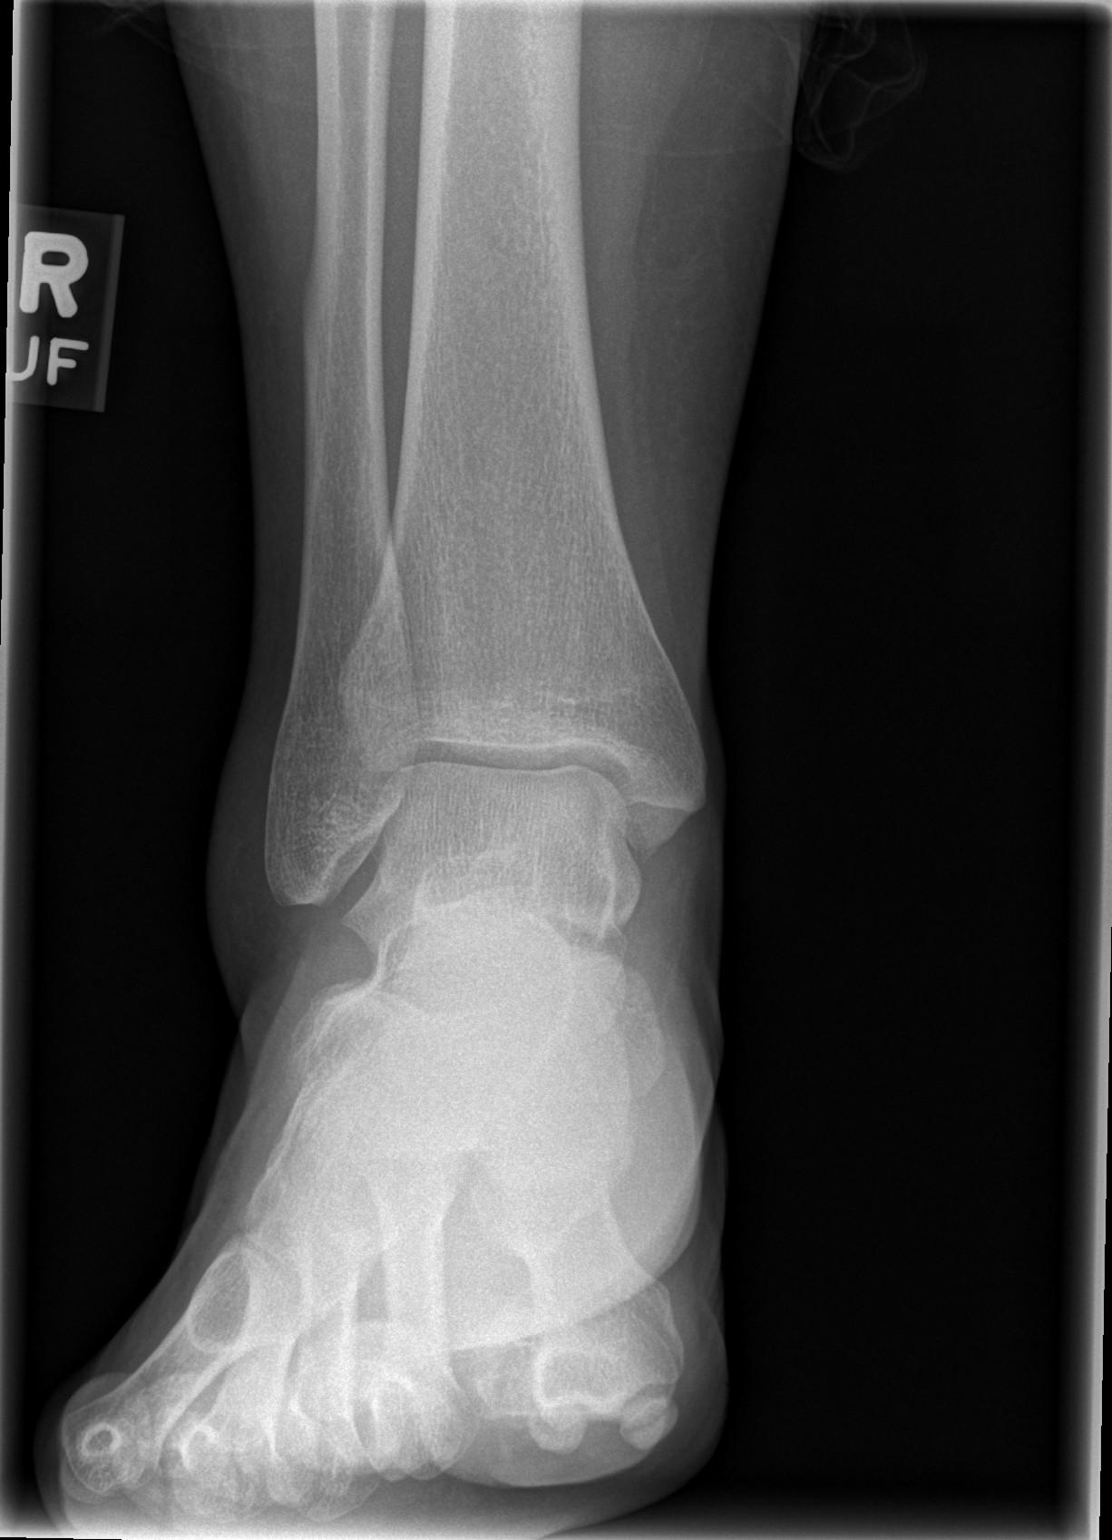

[t ankle joint oblique right]
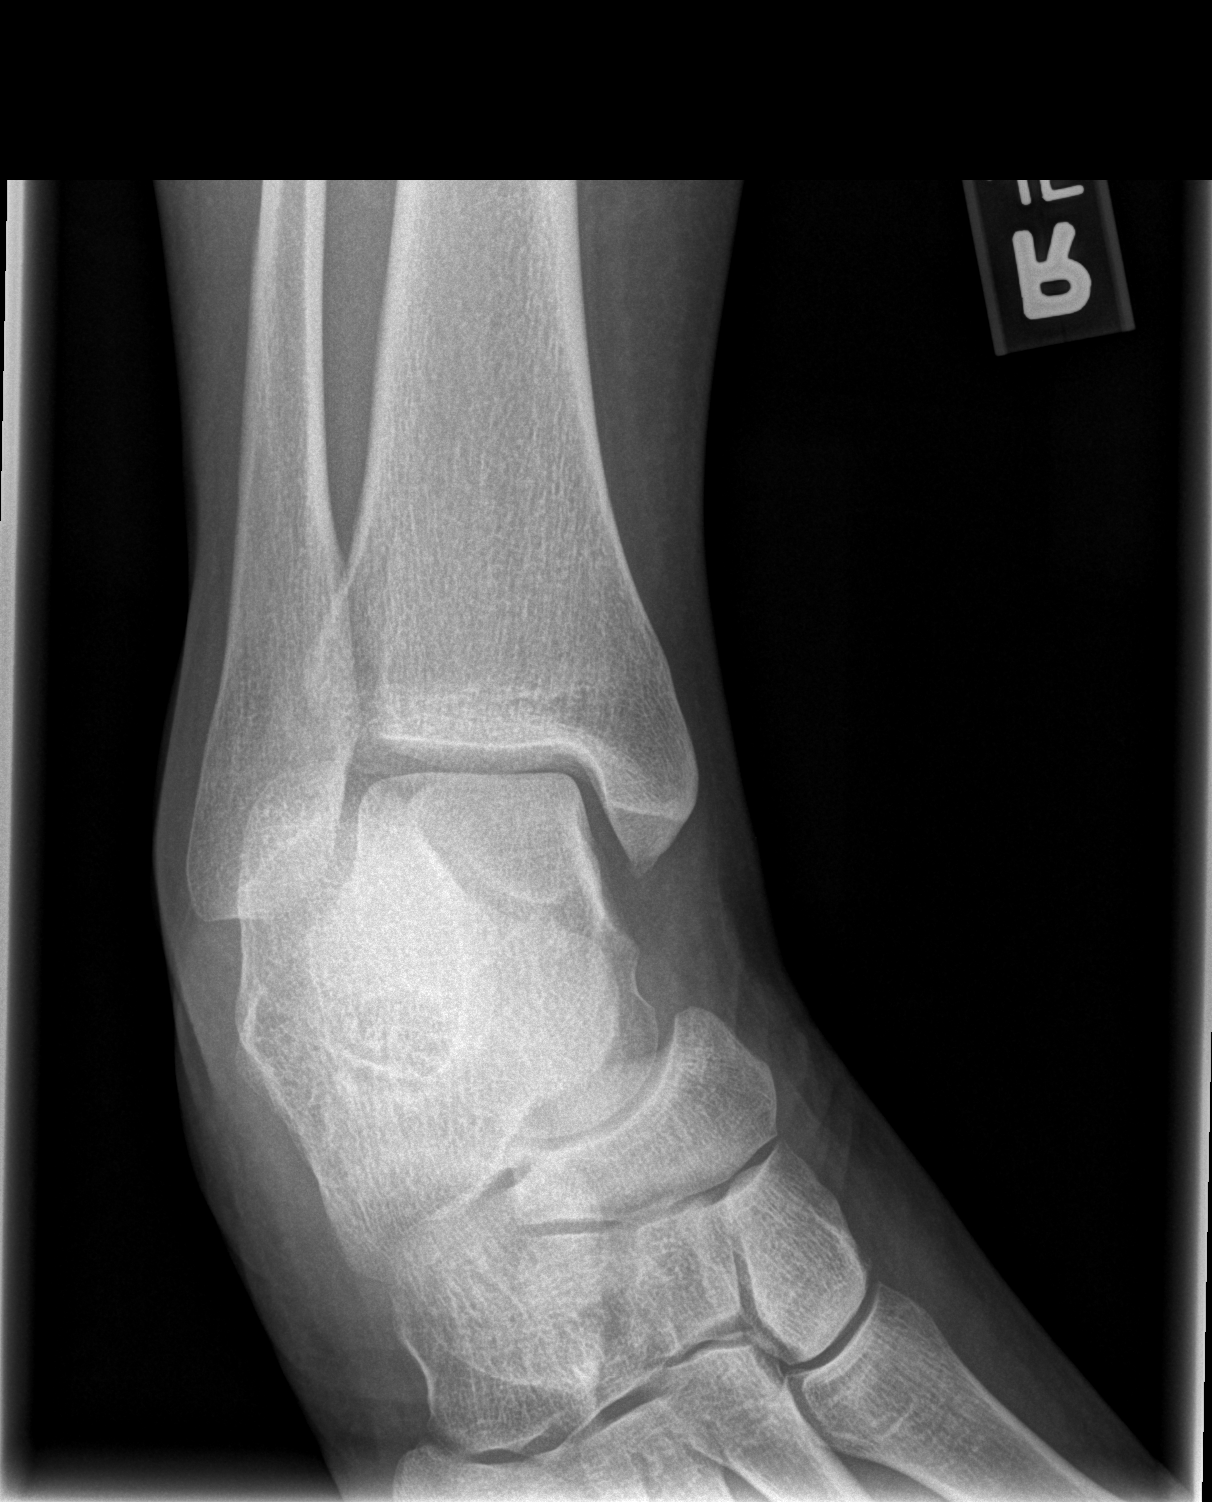

[t ankle joint lat right]
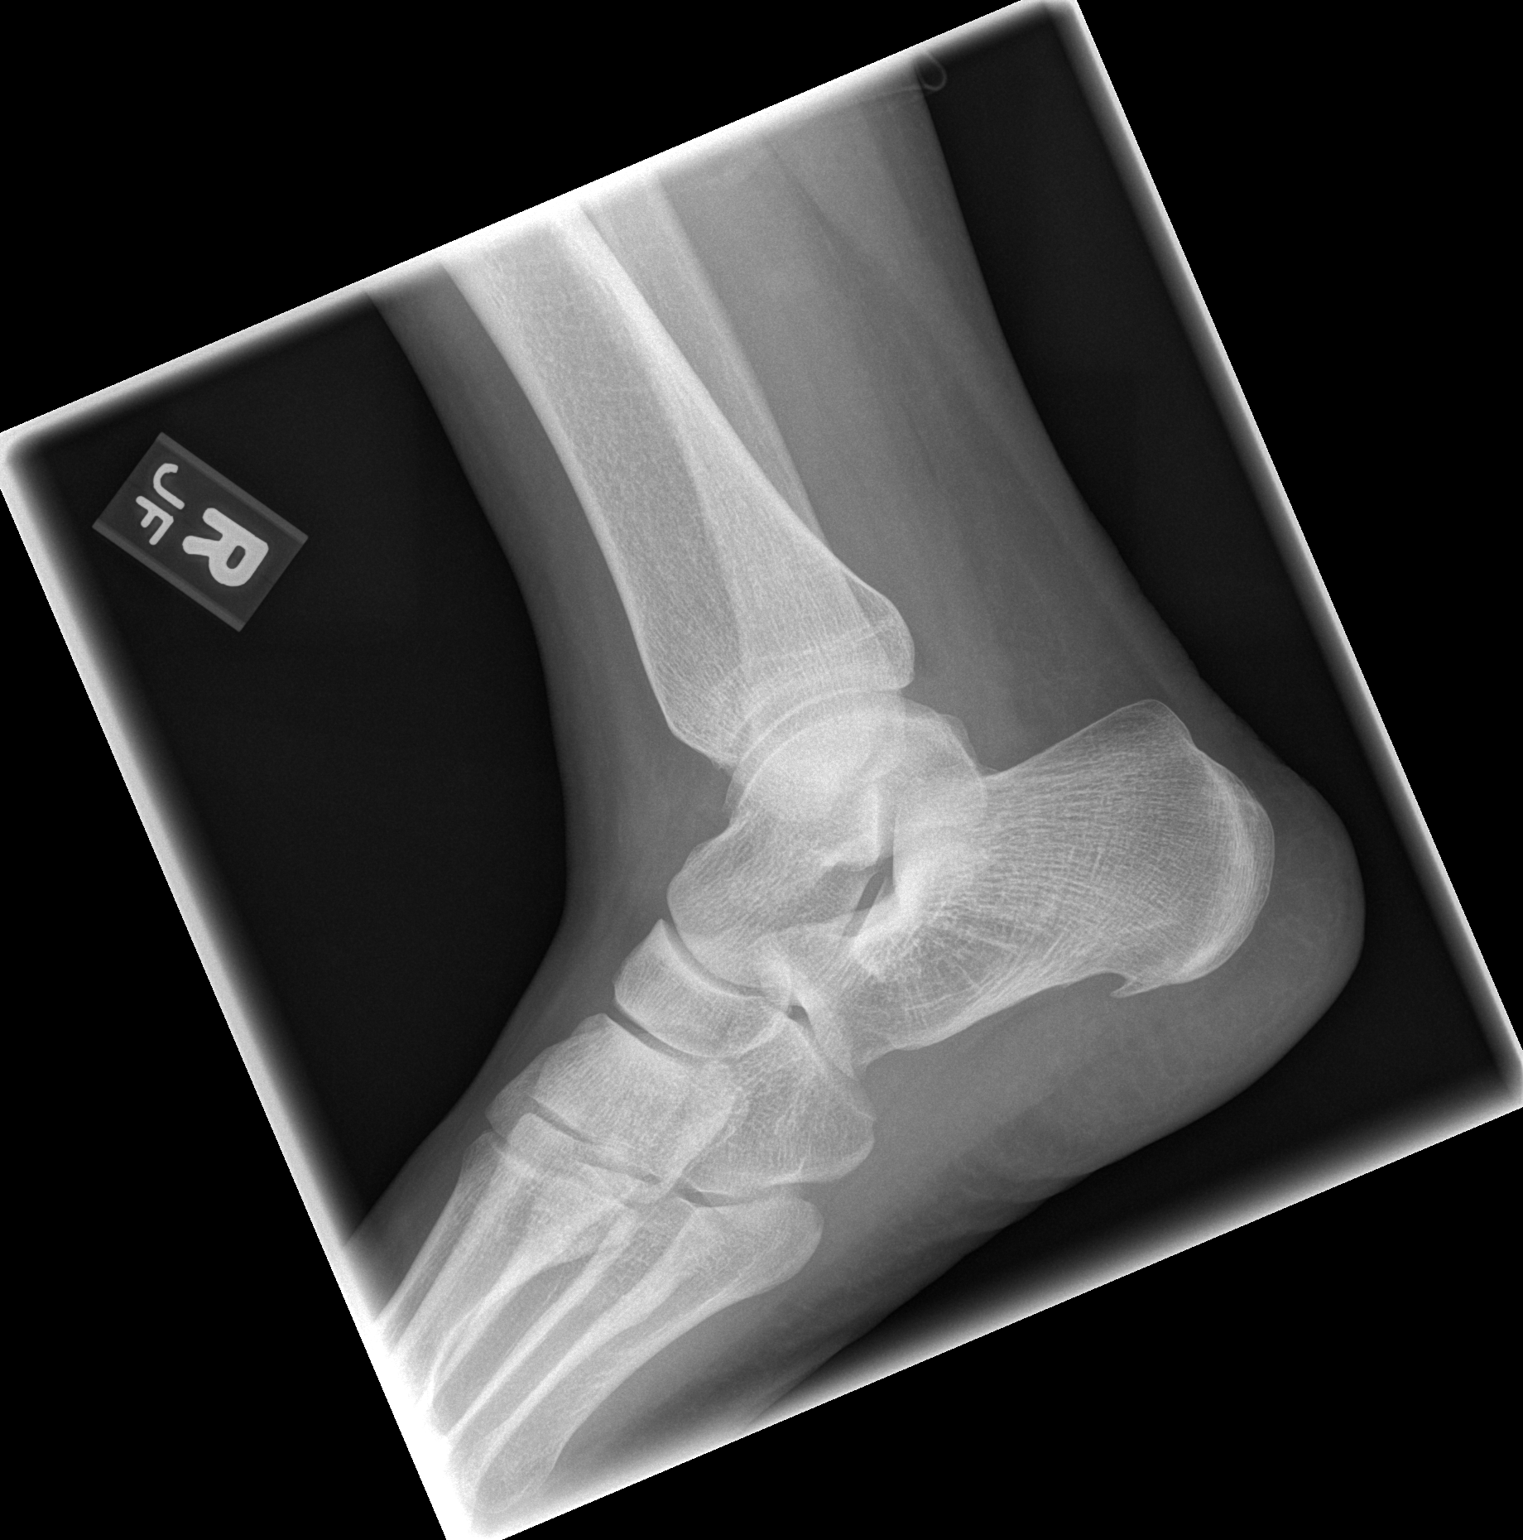

[3 of 3 positions shown; findings below may reference images not displayed]

FINDINGS: Mild soft tissue swelling over the lateral ankle. Ankle mortise is
normal. There is no fracture dislocation. Small inferior calcaneal
spur is present.
IMPRESSION: No acute fracture.

## 2017-01-07 ENCOUNTER — Encounter: Payer: Self-pay | Admitting: Family

## 2017-01-07 ENCOUNTER — Ambulatory Visit (INDEPENDENT_AMBULATORY_CARE_PROVIDER_SITE_OTHER): Payer: BC Managed Care – PPO | Admitting: Family

## 2017-01-07 VITALS — BP 127/77 | HR 94 | Temp 98.4°F | Resp 16 | Ht 65.0 in | Wt 284.2 lb

## 2017-01-07 DIAGNOSIS — J029 Acute pharyngitis, unspecified: Secondary | ICD-10-CM

## 2017-01-07 DIAGNOSIS — J302 Other seasonal allergic rhinitis: Secondary | ICD-10-CM

## 2017-01-07 LAB — POCT RAPID STREP A (OFFICE): Rapid Strep A Screen: NEGATIVE

## 2017-01-07 NOTE — Progress Notes (Signed)
Pre visit review using our clinic review tool, if applicable. No additional management support is needed unless otherwise documented below in the visit note. 

## 2017-01-07 NOTE — Patient Instructions (Addendum)
Try to keep calories to 1500/day. Get 30 minutes of exercise 5 days a week. Stop claritin and switch to zyrtec D for the next week or two. Let us know if symptoms worse or if they fail to improve. You can then switch back to your regular claritin tablet.  Let me know if you would like to meet with Dr. Dalbert GarnetBeasley in our medical weight loss clinic.

## 2017-01-07 NOTE — Progress Notes (Signed)
Subjective:    Patient ID: Lennox PippinsAllison N Grammatico, female    DOB: September 05, 1986, 31 y.o.   MRN: [id number]  HPI  Ms. Augustine RadarOverby is a 31 yr old female who presents today with chief complaint of ear pain.  Reports + sore throat as well. Reports that her throat began to hurt last night.  R ear began 2-3 days ago.  Has  29 year old son at home.  Notes positive nasal congestion, mild cough.    Morbid Obesity-  Weighed 240 lbs pre-pregnancy.  She has done weight watchers in the past.    Tries to go to the gym.   Wt Readings from Last 3 Encounters:  01/07/17 284 lb 3.2 oz (128.9 kg)  05/04/16 278 lb 2 oz (126.2 kg)  01/15/16 280 lb (127 kg)    Review of Systems See HPI  Past Medical History:  Diagnosis Date  . Environmental allergies   . Frequent headaches   . Hx of varicella   . Migraine   . Seasonal allergies   . Vaginal Pap smear, abnormal      Social History   Social History  . Marital status: Married    Spouse name: N/A  . Number of children: N/A  . Years of education: N/A   Occupational History  . Not on file.   Social History Main Topics  . Smoking status: Never Smoker  . Smokeless tobacco: Never Used  . Alcohol use 0.0 oz/week     Comment: rare -- 3 x month  . Drug use: No  . Sexual activity: Yes    Partners: Male   Other Topics Concern  . Not on file   Social History Narrative  . No narrative on file    Past Surgical History:  Procedure Laterality Date  . CESAREAN SECTION N/A 01/15/2016   Procedure: CESAREAN SECTION;  Surgeon: Olivia Mackieichard Taavon, MD;  Location: [facility];  Service: Obstetrics;  Laterality: N/A;  . DILATION AND CURETTAGE OF UTERUS    . WISDOM TOOTH EXTRACTION      Family History  Problem Relation Age of Onset  . Alcoholism Father     Resolved-Living  . Diabetes Mother     Hulda MarinLiivng  . Hyperlipidemia Mother   . Varicose Veins Mother   . Diabetes Paternal Grandfather   . Heart attack Paternal Grandfather   . Heart disease Paternal Aunt   .  Hypertension Maternal Grandmother   . Heart attack Maternal Grandfather     Allergies  Allergen Reactions  . Amoxicillin Nausea And Vomiting  . Hydrocodeine [Dihydrocodeine] Nausea And Vomiting  . Penicillins Nausea And Vomiting    Has patient had a PCN reaction causing immediate rash, facial/tongue/throat swelling, SOB or lightheadedness with hypotension: No Has patient had a PCN reaction causing severe rash involving mucus membranes or skin necrosis: No Has patient had a PCN reaction that required hospitalization No Has patient had a PCN reaction occurring within the last 10 years: No If all of the above answers are "NO", then may proceed with Cephalosporin use.     Current Outpatient Prescriptions on File Prior to Visit  Medication Sig Dispense Refill  . doxycycline (VIBRAMYCIN) 100 MG capsule Take 1 capsule (100 mg total) by mouth 2 (two) times daily. 14 capsule 0  . fluticasone (FLONASE) 50 MCG/ACT nasal spray One twice daily each nostril (Patient taking differently: Place 1 spray into the nose 2 (two) times daily. One twice daily each nostril)    . ibuprofen (ADVIL,MOTRIN) 600  MG tablet Take 1 tablet (600 mg total) by mouth every 6 (six) hours. (Patient taking differently: Take 600 mg by mouth every 6 (six) hours as needed. ) 30 tablet 0  . loratadine (CLARITIN) 10 MG tablet Take 1 tablet (10 mg total) by mouth daily. 30 tablet 0   No current facility-administered medications on file prior to visit.     BP 127/77 (BP Location: Left Arm, Patient Position: Sitting, Cuff Size: Large)   Pulse 94   Temp 98.4 F (36.9 C) (Oral)   Resp 16   Ht 5\' 5"  (1.651 m)   Wt 284 lb 3.2 oz (128.9 kg)   LMP 12/26/2016   SpO2 98%   BMI 47.29 kg/m       Objective:   Physical Exam  Constitutional: She is oriented to person, place, and time. She appears well-developed and well-nourished.  HENT:  Head: Normocephalic and atraumatic.  Mouth/Throat: No oropharyngeal exudate, posterior  oropharyngeal edema or posterior oropharyngeal erythema.  Bilateral Serous effusions, biltateral TM's without erythema.  Cardiovascular: Normal rate, regular rhythm and normal heart sounds.   No murmur heard. Pulmonary/Chest: Effort normal and breath sounds normal. No respiratory distress. She has no wheezes.  Musculoskeletal: She exhibits no edema.  Neurological: She is alert and oriented to person, place, and time.  Skin: Skin is warm and dry.  Psychiatric: She has a normal mood and affect. Her behavior is normal. Judgment and thought content normal.          Assessment & Plan:  Morbid obesity- discussed healthy diet, exercise, calorie counting goals. Offered referral to medical weight loss clinic.  Pt will consider and let me know. 20 min spent with pt today. >50% of this time was spent counseling pt on diet/exercise and weight loss.   Seasonal allergies- trial of zyrtec D. Rapid strep is negative.  Advised pt to call if new/worsening symptoms.

## 2017-06-21 ENCOUNTER — Telehealth: Payer: Self-pay | Admitting: Emergency Medicine

## 2017-06-21 NOTE — Telephone Encounter (Signed)
LMOVM advising patient she is due for CPE. Please call the office to schedule an appointment.

## 2017-08-07 ENCOUNTER — Telehealth: Payer: BC Managed Care – PPO | Admitting: Nurse Practitioner

## 2017-08-07 DIAGNOSIS — R05 Cough: Secondary | ICD-10-CM

## 2017-08-07 DIAGNOSIS — R059 Cough, unspecified: Secondary | ICD-10-CM

## 2017-08-07 MED ORDER — AZITHROMYCIN 250 MG PO TABS: ORAL_TABLET | ORAL | 0 refills | Status: DC

## 2017-08-07 NOTE — Progress Notes (Signed)

## 2017-12-04 ENCOUNTER — Encounter: Payer: Self-pay | Admitting: Physician Assistant

## 2017-12-04 ENCOUNTER — Ambulatory Visit: Payer: Self-pay | Admitting: Family Medicine

## 2017-12-04 ENCOUNTER — Ambulatory Visit: Payer: BC Managed Care – PPO | Admitting: Physician Assistant

## 2017-12-04 ENCOUNTER — Other Ambulatory Visit: Payer: Self-pay

## 2017-12-04 VITALS — BP 120/84 | HR 114 | Temp 98.9°F | Resp 16 | Ht 65.0 in | Wt 275.0 lb

## 2017-12-04 DIAGNOSIS — M940 Chondrocostal junction syndrome [Tietze]: Secondary | ICD-10-CM

## 2017-12-04 DIAGNOSIS — R52 Pain, unspecified: Secondary | ICD-10-CM

## 2017-12-04 DIAGNOSIS — R6889 Other general symptoms and signs: Secondary | ICD-10-CM

## 2017-12-04 LAB — POC INFLUENZA A&B (BINAX/QUICKVUE)
Influenza A, POC: NEGATIVE
Influenza B, POC: NEGATIVE

## 2017-12-04 MED ORDER — OSELTAMIVIR PHOSPHATE 75 MG PO CAPS: 75.0000 mg | ORAL_CAPSULE | Freq: Two times a day (BID) | ORAL | 0 refills | Status: DC

## 2017-12-04 MED ORDER — BENZONATATE 100 MG PO CAPS: 100.0000 mg | ORAL_CAPSULE | Freq: Two times a day (BID) | ORAL | 0 refills | Status: DC | PRN

## 2017-12-04 NOTE — Patient Instructions (Signed)
Please stay well-hydrated and get plenty of rest.  Start the Tamiflu and Tessalon as directed. Take Theraflu over the counter to help with symptoms.   I am taking you out of work for the rest of the week to recuperate.   The pain in anterior chest is from irritation in the ribcage/sternum.  The tylenol in Theraflu will help with this.  Apply topical Icy Hot to the area as well. This will improve as we get the cough under control.

## 2017-12-04 NOTE — Progress Notes (Signed)
Patient presents to clinic today c/o fever noted on Monday at 100 associated with nausea. Since Tuesday has noted a dry cough with chest tightness. Notes some discomfort with coughing. Has noted some loose stools over the past day or so. Denies SOB, palpitations, LH or dizziness. Has noted a headache that was alleviated with Ibuprofen. Notes mild appetite. Is staying very well hydrated. Unsure of fever. Has had multiple exposures to influenza recently.   Past Medical History:  Diagnosis Date  . Environmental allergies   . Frequent headaches   . Hx of varicella   . Migraine   . Seasonal allergies   . Vaginal Pap smear, abnormal     Current Outpatient Medications on File Prior to Visit  Medication Sig Dispense Refill  . fluticasone (FLONASE) 50 MCG/ACT nasal spray One twice daily each nostril (Patient taking differently: Place 1 spray into the nose 2 (two) times daily. One twice daily each nostril)    . loratadine (CLARITIN) 10 MG tablet Take 1 tablet (10 mg total) by mouth daily. 30 tablet 0   No current facility-administered medications on file prior to visit.     Allergies  Allergen Reactions  . Amoxicillin Nausea And Vomiting  . Hydrocodeine [Dihydrocodeine] Nausea And Vomiting  . Penicillins Nausea And Vomiting    Has patient had a PCN reaction causing immediate rash, facial/tongue/throat swelling, SOB or lightheadedness with hypotension: No Has patient had a PCN reaction causing severe rash involving mucus membranes or skin necrosis: No Has patient had a PCN reaction that required hospitalization No Has patient had a PCN reaction occurring within the last 10 years: No If all of the above answers are "NO", then may proceed with Cephalosporin use.     Family History  Problem Relation Age of Onset  . Alcoholism Father        Resolved-Living  . Diabetes Mother        Hulda MarinLiivng  . Hyperlipidemia Mother   . Varicose Veins Mother   . Diabetes Paternal Grandfather   . Heart  attack Paternal Grandfather   . Heart disease Paternal Aunt   . Hypertension Maternal Grandmother   . Heart attack Maternal Grandfather     Social History   Socioeconomic History  . Marital status: Married    Spouse name: None  . Number of children: None  . Years of education: None  . Highest education level: None  Social Needs  . Financial resource strain: None  . Food insecurity - worry: None  . Food insecurity - inability: None  . Transportation needs - medical: None  . Transportation needs - non-medical: None  Occupational History  . None  Tobacco Use  . Smoking status: Never Smoker  . Smokeless tobacco: Never Used  Substance and Sexual Activity  . Alcohol use: Yes    Alcohol/week: 0.0 oz    Comment: rare -- 3 x month  . Drug use: No  . Sexual activity: Yes    Partners: Male  Other Topics Concern  . None  Social History Narrative  . None   Review of Systems - See HPI.  All other ROS are negative.  BP 120/84   Pulse (!) 114   Temp 98.9 F (37.2 C) (Oral)   Resp 16   Ht 5\' 5"  (1.651 m)   Wt 275 lb (124.7 kg)   SpO2 98%   BMI 45.76 kg/m   Physical Exam  Constitutional: She is oriented to person, place, and time and well-developed, well-nourished, and  in no distress.  HENT:  Head: Normocephalic and atraumatic.  Eyes: Conjunctivae are normal.  Neck: Neck supple.  Cardiovascular: Normal rate, regular rhythm, normal heart sounds and intact distal pulses.  Pulmonary/Chest: Effort normal and breath sounds normal. No respiratory distress. She has no wheezes. She has no rales. She exhibits no tenderness.  Abdominal: Soft. Bowel sounds are normal. She exhibits no distension. There is no tenderness.  Neurological: She is alert and oriented to person, place, and time.  Skin: Skin is warm and dry. No rash noted.  Psychiatric: Affect normal.   Assessment/Plan:  Flu-like symptoms Flu swab negative but known exposure and flu symptoms. Start Tamiflu and Tessalon.  Supportive measures and OTC medications reviewed. Strict return precautions discussed with patient.  - oseltamivir (TAMIFLU) 75 MG capsule; Take 1 capsule (75 mg total) by mouth 2 (two) times daily.  Dispense: 10 capsule; Refill: 0 - benzonatate (TESSALON) 100 MG capsule; Take 1 capsule (100 mg total) by mouth 2 (two) times daily as needed for cough.  Dispense: 20 capsule; Refill: 0  Costochondritis 2/2 coughing. Tylenol recommended along with supportive measures.    Piedad Climes, PA-C

## 2017-12-15 ENCOUNTER — Encounter: Payer: Self-pay | Admitting: Physician Assistant

## 2017-12-21 ENCOUNTER — Encounter: Payer: Self-pay | Admitting: Physician Assistant

## 2018-01-01 ENCOUNTER — Encounter: Payer: Self-pay | Admitting: Physician Assistant

## 2018-01-01 ENCOUNTER — Other Ambulatory Visit: Payer: Self-pay

## 2018-01-01 ENCOUNTER — Ambulatory Visit: Payer: BC Managed Care – PPO | Admitting: Physician Assistant

## 2018-01-01 VITALS — BP 126/88 | HR 103 | Temp 98.5°F | Resp 16 | Ht 65.0 in | Wt 278.0 lb

## 2018-01-01 DIAGNOSIS — J0120 Acute ethmoidal sinusitis, unspecified: Secondary | ICD-10-CM

## 2018-01-01 MED ORDER — DOXYCYCLINE HYCLATE 100 MG PO CAPS: 100.0000 mg | ORAL_CAPSULE | Freq: Two times a day (BID) | ORAL | 0 refills | Status: DC

## 2018-01-01 MED ORDER — PHENTERMINE HCL 15 MG PO CAPS: 15.0000 mg | ORAL_CAPSULE | ORAL | 0 refills | Status: DC

## 2018-01-01 MED ORDER — PROMETHAZINE-DM 6.25-15 MG/5ML PO SYRP: 5.0000 mL | ORAL_SOLUTION | Freq: Four times a day (QID) | ORAL | 0 refills | Status: DC | PRN

## 2018-01-01 NOTE — Patient Instructions (Signed)
Please take antibiotic as directed.  Increase fluid intake.  Use Saline nasal spray.  Take a daily multivitamin. Continue Delsym for daytime cough. Promethazine-DM for nighttime cough.  Place a humidifier in the bedroom.  Please call or return clinic if symptoms are not improving.  Sinusitis Sinusitis is redness, soreness, and swelling (inflammation) of the paranasal sinuses. Paranasal sinuses are air pockets within the bones of your face (beneath the eyes, the middle of the forehead, or above the eyes). In healthy paranasal sinuses, mucus is able to drain out, and air is able to circulate through them by way of your nose. However, when your paranasal sinuses are inflamed, mucus and air can become trapped. This can allow bacteria and other germs to grow and cause infection. Sinusitis can develop quickly and last only a short time (acute) or continue over a long period (chronic). Sinusitis that lasts for more than 12 weeks is considered chronic.  CAUSES  Causes of sinusitis include:  Allergies.  Structural abnormalities, such as displacement of the cartilage that separates your nostrils (deviated septum), which can decrease the air flow through your nose and sinuses and affect sinus drainage.  Functional abnormalities, such as when the small hairs (cilia) that line your sinuses and help remove mucus do not work properly or are not present. SYMPTOMS  Symptoms of acute and chronic sinusitis are the same. The primary symptoms are pain and pressure around the affected sinuses. Other symptoms include:  Upper toothache.  Earache.  Headache.  Bad breath.  Decreased sense of smell and taste.  A cough, which worsens when you are lying flat.  Fatigue.  Fever.  Thick drainage from your nose, which often is green and may contain pus (purulent).  Swelling and warmth over the affected sinuses. DIAGNOSIS  Your caregiver will perform a physical exam. During the exam, your caregiver may:  Look in  your nose for signs of abnormal growths in your nostrils (nasal polyps).  Tap over the affected sinus to check for signs of infection.  View the inside of your sinuses (endoscopy) with a special imaging device with a light attached (endoscope), which is inserted into your sinuses. If your caregiver suspects that you have chronic sinusitis, one or more of the following tests may be recommended:  Allergy tests.  Nasal culture A sample of mucus is taken from your nose and sent to a lab and screened for bacteria.  Nasal cytology A sample of mucus is taken from your nose and examined by your caregiver to determine if your sinusitis is related to an allergy. TREATMENT  Most cases of acute sinusitis are related to a viral infection and will resolve on their own within 10 days. Sometimes medicines are prescribed to help relieve symptoms (pain medicine, decongestants, nasal steroid sprays, or saline sprays).  However, for sinusitis related to a bacterial infection, your caregiver will prescribe antibiotic medicines. These are medicines that will help kill the bacteria causing the infection.  Rarely, sinusitis is caused by a fungal infection. In theses cases, your caregiver will prescribe antifungal medicine. For some cases of chronic sinusitis, surgery is needed. Generally, these are cases in which sinusitis recurs more than 3 times per year, despite other treatments. HOME CARE INSTRUCTIONS   Drink plenty of water. Water helps thin the mucus so your sinuses can drain more easily.  Use a humidifier.  Inhale steam 3 to 4 times a day (for example, sit in the bathroom with the shower running).  Apply a warm, moist washcloth  to your face 3 to 4 times a day, or as directed by your caregiver.  Use saline nasal sprays to help moisten and clean your sinuses.  Take over-the-counter or prescription medicines for pain, discomfort, or fever only as directed by your caregiver. SEEK IMMEDIATE MEDICAL CARE  IF:  You have increasing pain or severe headaches.  You have nausea, vomiting, or drowsiness.  You have swelling around your face.  You have vision problems.  You have a stiff neck.  You have difficulty breathing. MAKE SURE YOU:   Understand these instructions.  Will watch your condition.  Will get help right away if you are not doing well or get worse. Document Released: 10/01/2005 Document Revised: 12/24/2011 Document Reviewed: 10/16/2011 [facility] Patient Information 2014 [place], [place].

## 2018-01-01 NOTE — Progress Notes (Signed)
Patient presents to clinic today c/o 1.5 weeks of sore throat, dry cough that initially resolved but has recurred over the past few days along with a now productive cough, sinus pain (ethmoid region).  Notes sore throat is significant. Denies fever, chills. Notes some nasal congestion. Is hydrating well and taking Tessalon which is only slightly helping.   Also is working hard on diet and exercise. Would like to discuss supplemental RX to help kickstart weight loss. Patient denies chest pain, palpitations, lightheadedness, dizziness, vision changes or frequent headaches.   Past Medical History:  Diagnosis Date  . Environmental allergies   . Frequent headaches   . Hx of varicella   . Migraine   . Seasonal allergies   . Vaginal Pap smear, abnormal     Current Outpatient Medications on File Prior to Visit  Medication Sig Dispense Refill  . benzonatate (TESSALON) 100 MG capsule Take 1 capsule (100 mg total) by mouth 2 (two) times daily as needed for cough. 20 capsule 0  . fluticasone (FLONASE) 50 MCG/ACT nasal spray One twice daily each nostril (Patient taking differently: Place 1 spray into the nose 2 (two) times daily. One twice daily each nostril)    . loratadine (CLARITIN) 10 MG tablet Take 1 tablet (10 mg total) by mouth daily. (Patient not taking: Reported on 01/01/2018) 30 tablet 0   No current facility-administered medications on file prior to visit.     Allergies  Allergen Reactions  . Amoxicillin Nausea And Vomiting  . Hydrocodeine [Dihydrocodeine] Nausea And Vomiting  . Penicillins Nausea And Vomiting    Has patient had a PCN reaction causing immediate rash, facial/tongue/throat swelling, SOB or lightheadedness with hypotension: No Has patient had a PCN reaction causing severe rash involving mucus membranes or skin necrosis: No Has patient had a PCN reaction that required hospitalization No Has patient had a PCN reaction occurring within the last 10 years: No If all of the  above answers are "NO", then may proceed with Cephalosporin use.     Family History  Problem Relation Age of Onset  . Alcoholism Father        Resolved-Living  . Diabetes Mother        Hulda MarinLiivng  . Hyperlipidemia Mother   . Varicose Veins Mother   . Diabetes Paternal Grandfather   . Heart attack Paternal Grandfather   . Heart disease Paternal Aunt   . Hypertension Maternal Grandmother   . Heart attack Maternal Grandfather     Social History   Socioeconomic History  . Marital status: Married    Spouse name: None  . Number of children: None  . Years of education: None  . Highest education level: None  Social Needs  . Financial resource strain: None  . Food insecurity - worry: None  . Food insecurity - inability: None  . Transportation needs - medical: None  . Transportation needs - non-medical: None  Occupational History  . None  Tobacco Use  . Smoking status: Never Smoker  . Smokeless tobacco: Never Used  Substance and Sexual Activity  . Alcohol use: Yes    Alcohol/week: 0.0 oz    Comment: rare -- 3 x month  . Drug use: No  . Sexual activity: Yes    Partners: Male  Other Topics Concern  . None  Social History Narrative  . None   Review of Systems - See HPI.  All other ROS are negative.  BP 126/88   Pulse (!) 103   Temp 98.5  F (36.9 C) (Oral)   Resp 16   Ht 5\' 5"  (1.651 m)   Wt 278 lb (126.1 kg)   SpO2 98%   BMI 46.26 kg/m   Physical Exam  Constitutional: She is oriented to person, place, and time and well-developed, well-nourished, and in no distress.  HENT:  Head: Normocephalic and atraumatic.  Right Ear: Tympanic membrane normal.  Left Ear: Tympanic membrane normal.  Nose: Mucosal edema and rhinorrhea present.  Mouth/Throat: Uvula is midline, oropharynx is clear and moist and mucous membranes are normal.  Eyes: Conjunctivae are normal.  Neck: Neck supple.  Cardiovascular: Normal rate, regular rhythm, normal heart sounds and intact distal pulses.   Pulmonary/Chest: Effort normal and breath sounds normal. No respiratory distress. She has no wheezes. She has no rales. She exhibits no tenderness.  Neurological: She is alert and oriented to person, place, and time.  Skin: Skin is warm and dry. No rash noted.  Psychiatric: Affect normal.  Vitals reviewed.  Recent Results (from the past 2160 hour(s))  POC Influenza A&B(BINAX/QUICKVUE)     Status: Normal   Collection Time: 12/04/17  2:57 PM  Result Value Ref Range   Influenza A, POC Negative Negative   Influenza B, POC Negative Negative   Assessment/Plan: 1. Severe obesity (BMI >= 40) (HCC) Discussed dietary and exercise recommendations.  - phentermine 15 MG capsule; Take 1 capsule (15 mg total) by mouth every morning.  Dispense: 30 capsule; Refill: 0  2. Acute non-recurrent ethmoidal sinusitis Rx Doxycycline.  Increase fluids.  Rest.  Saline nasal spray.  Probiotic.  Mucinex as directed.  Humidifier in bedroom. Promethazine-DM.  Call or return to clinic if symptoms are not improving.  - doxycycline (VIBRAMYCIN) 100 MG capsule; Take 1 capsule (100 mg total) by mouth 2 (two) times daily.  Dispense: 20 capsule; Refill: 0   Piedad Climes, PA-C

## 2018-02-05 ENCOUNTER — Ambulatory Visit: Payer: BC Managed Care – PPO | Admitting: Physician Assistant

## 2018-05-26 ENCOUNTER — Encounter: Payer: Self-pay | Admitting: Physician Assistant

## 2018-08-05 ENCOUNTER — Ambulatory Visit: Payer: BC Managed Care – PPO | Admitting: Medical

## 2018-08-05 ENCOUNTER — Encounter: Payer: Self-pay | Admitting: Medical

## 2018-08-05 VITALS — BP 131/82 | HR 103 | Temp 98.4°F | Resp 16 | Ht 65.0 in | Wt 280.4 lb

## 2018-08-05 DIAGNOSIS — L089 Local infection of the skin and subcutaneous tissue, unspecified: Secondary | ICD-10-CM | POA: Diagnosis not present

## 2018-08-05 MED ORDER — DOXYCYCLINE HYCLATE 100 MG PO TABS: 100.0000 mg | ORAL_TABLET | Freq: Two times a day (BID) | ORAL | 0 refills | Status: DC

## 2018-08-05 MED ORDER — DICLOFENAC SODIUM 75 MG PO TBEC: 75.0000 mg | DELAYED_RELEASE_TABLET | Freq: Two times a day (BID) | ORAL | 0 refills | Status: DC

## 2018-08-05 MED ORDER — MUPIROCIN 2 % EX OINT: TOPICAL_OINTMENT | CUTANEOUS | 0 refills | Status: DC

## 2018-08-05 MED ORDER — CEFTRIAXONE SODIUM 1 G IJ SOLR
1.0000 g | Freq: Once | INTRAMUSCULAR | Status: AC
  Administered 2018-08-05: 1 g via INTRAMUSCULAR

## 2018-08-05 NOTE — Addendum Note (Signed)
Addended by: Orlene Och on: 08/05/2018 05:52 PM   Modules accepted: Orders

## 2018-08-05 NOTE — Progress Notes (Signed)
Subjective:    Patient ID: Alicia Christensen, female    DOB: April 29, 1986, 32 y.o.   MRN: [id number]  HPI  Pt in with tender area on both axillary areas. First came up Saturday. Left side came up first then rt side came up on Sunday. Pt never had areas like this before. No fever, no chills or sweats. Pt has tried some warm compresses and did not help.   LMP- started today. Came when expected and normal.  Pt has been on cephalsporins before and no reaction.    Review of Systems  Constitutional: Negative for chills, fatigue and fever.  Respiratory: Negative for cough, chest tightness, shortness of breath and wheezing.   Cardiovascular: Negative for chest pain and palpitations.  Gastrointestinal: Negative for abdominal pain.  Musculoskeletal: Negative for back pain.  Neurological: Negative for dizziness, seizures, syncope, weakness and light-headedness.  Hematological: Negative for adenopathy. Does not bruise/bleed easily.  Psychiatric/Behavioral: Negative for behavioral problems and confusion.    Past Medical History:  Diagnosis Date  . Environmental allergies   . Frequent headaches   . Hx of varicella   . Migraine   . Seasonal allergies   . Vaginal Pap smear, abnormal      Social History   Socioeconomic History  . Marital status: Married    Spouse name: Not on file  . Number of children: Not on file  . Years of education: Not on file  . Highest education level: Not on file  Occupational History  . Not on file  Social Needs  . Financial resource strain: Not on file  . Food insecurity:    Worry: Not on file    Inability: Not on file  . Transportation needs:    Medical: Not on file    Non-medical: Not on file  Tobacco Use  . Smoking status: Never Smoker  . Smokeless tobacco: Never Used  Substance and Sexual Activity  . Alcohol use: Yes    Alcohol/week: 0.0 standard drinks    Comment: rare -- 3 x month  . Drug use: No  . Sexual activity: Yes    Partners: Male    Lifestyle  . Physical activity:    Days per week: Not on file    Minutes per session: Not on file  . Stress: Not on file  Relationships  . Social connections:    Talks on phone: Not on file    Gets together: Not on file    Attends religious service: Not on file    Active member of club or organization: Not on file    Attends meetings of clubs or organizations: Not on file    Relationship status: Not on file  . Intimate partner violence:    Fear of current or ex partner: Not on file    Emotionally abused: Not on file    Physically abused: Not on file    Forced sexual activity: Not on file  Other Topics Concern  . Not on file  Social History Narrative  . Not on file    Past Surgical History:  Procedure Laterality Date  . CESAREAN SECTION N/A 01/15/2016   Procedure: CESAREAN SECTION;  Surgeon: Olivia Mackie, MD;  Location: [facility];  Service: Obstetrics;  Laterality: N/A;  . DILATION AND CURETTAGE OF UTERUS    . WISDOM TOOTH EXTRACTION      Family History  Problem Relation Age of Onset  . Alcoholism Father        Resolved-Living  . Diabetes  Mother        Liivng  . Hyperlipidemia Mother   . Varicose Veins Mother   . Diabetes Paternal Grandfather   . Heart attack Paternal Grandfather   . Heart disease Paternal Aunt   . Hypertension Maternal Grandmother   . Heart attack Maternal Grandfather     Allergies  Allergen Reactions  . Amoxicillin Nausea And Vomiting  . Hydrocodeine [Dihydrocodeine] Nausea And Vomiting  . Penicillins Nausea And Vomiting    Has patient had a PCN reaction causing immediate rash, facial/tongue/throat swelling, SOB or lightheadedness with hypotension: No Has patient had a PCN reaction causing severe rash involving mucus membranes or skin necrosis: No Has patient had a PCN reaction that required hospitalization No Has patient had a PCN reaction occurring within the last 10 years: No If all of the above answers are "NO", then may proceed with  Cephalosporin use.     Current Outpatient Medications on File Prior to Visit  Medication Sig Dispense Refill  . fluticasone (FLONASE) 50 MCG/ACT nasal spray One twice daily each nostril (Patient taking differently: Place 1 spray into the nose 2 (two) times daily. One twice daily each nostril)    . loratadine (CLARITIN) 10 MG tablet Take 1 tablet (10 mg total) by mouth daily. 30 tablet 0   No current facility-administered medications on file prior to visit.     BP 131/82   Pulse (!) 103   Temp 98.4 F (36.9 C) (Oral)   Resp 16   Ht 5\' 5"  (1.651 m)   Wt 280 lb 6.4 oz (127.2 kg)   SpO2 98%   BMI 46.66 kg/m       Objective:   Physical Exam  General- No acute distress. Pleasant patient. Neck- Full range of motion, no jvd Lungs- Clear, even and unlabored. Heart- regular rate and rhythm. Neurologic- CNII- XII grossly intact.  Rt axillary area- 1 cm mild pinkish area indurated and mid tender. No fluctance. Left axillary area- 3 small areas mild red. Faint tender and indurated. No fluctuance.      Assessment & Plan:  You appear to have probable early infection of axillary sweat glands, the areas do not appear to have formed abscesses.  We gave you Rocephin 1 g IM injection today and I want you to start doxycycline over the next 7 days.  Can continue the warm compresses twice daily.  Hopefully these areas will wear down but there is possibility that they could rupture and drain if abscess forms.  Also if area enlarges with soft center then you might need I&D.  If severe worsening after hours then recommend ED evaluation.  If area worsens rather than improves by tomorrow also give [place] update.  Mupirocin topical ointment was given for possible future events in early stages.  Follow-up in 7 days or as needed.  Esperanza Richters, PA-C

## 2018-08-05 NOTE — Patient Instructions (Addendum)
You appear to have probable early infection of axillary sweat glands, the areas do not appear to have formed abscesses.  We gave you Rocephin 1 g IM injection today and I want you to start doxycycline over the next 7 days.  Can continue the warm compresses twice daily.  Hopefully these areas will wear down but there is possibility that they could rupture and drain if abscess forms.  Also if area enlarges with soft center then you might need I&D.  If severe worsening after hours then recommend ED evaluation.  If area worsens rather than improves by tomorrow also give [place] update.  Mupirocin topical ointment was given for possible future events in early stages.  Follow-up in 7 days or as needed.

## 2018-09-22 ENCOUNTER — Encounter: Payer: Self-pay | Admitting: Medical

## 2018-11-26 ENCOUNTER — Ambulatory Visit: Payer: BC Managed Care – PPO | Admitting: Family Medicine

## 2018-11-26 ENCOUNTER — Encounter: Payer: Self-pay | Admitting: Medical

## 2018-11-26 VITALS — BP 129/78 | HR 92 | Temp 98.1°F | Resp 16 | Ht 65.0 in | Wt 285.2 lb

## 2018-11-26 DIAGNOSIS — B9789 Other viral agents as the cause of diseases classified elsewhere: Secondary | ICD-10-CM | POA: Diagnosis not present

## 2018-11-26 DIAGNOSIS — J069 Acute upper respiratory infection, unspecified: Secondary | ICD-10-CM | POA: Diagnosis not present

## 2018-11-26 MED ORDER — PROMETHAZINE-DM 6.25-15 MG/5ML PO SYRP: 5.0000 mL | ORAL_SOLUTION | Freq: Four times a day (QID) | ORAL | 0 refills | Status: DC | PRN

## 2018-11-26 NOTE — Progress Notes (Signed)
Chief Complaint  Patient presents with  . Cough    Alicia Christensen here for URI complaints.  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 Environmental allergies   . Frequent headaches   . Hx of varicella   . Migraine   . Seasonal allergies   . Vaginal Pap smear, abnormal     BP 129/78   Pulse 92   Temp 98.1 F (36.7 C) (Oral)   Resp 16   Ht 5\' 5"  (1.651 m)   Wt 285 lb 3.2 oz (129.4 kg)   SpO2 98%   BMI 47.46 kg/m  General: Awake, alert, appears stated age HEENT: AT, [place], ears patent b/l and TM's neg, nares patent w/o discharge, pharynx pink and without exudates, MMM Neck: No masses or asymmetry Heart: RRR Lungs: CTAB, no accessory muscle use Psych: Age appropriate judgment and insight, normal mood and affect  Viral URI with cough - Plan: promethazine-dextromethorphan (PROMETHAZINE-DM) 6.25-15 MG/5ML syrup  Supportive care. Warnings about sedation from syrup given.  Continue to push fluids, practice good hand hygiene, cover mouth when coughing. F/u prn. If starting to experience fevers, shaking, or shortness of breath, seek immediate care. Pt voiced understanding and agreement to the plan.  Jilda Roche [place], DO 11/26/18 4:56 PM

## 2018-11-26 NOTE — Patient Instructions (Signed)
Continue to push fluids, practice good hand hygiene, and cover your mouth if you cough.  If you start having fevers, shaking or shortness of breath, seek immediate care.  For symptoms, consider using Vick's VapoRub on chest or under nose, air humidifier, Benadryl at night, and elevating the head of the bed. Tylenol and ibuprofen for aches and pains you may be experiencing.   Let us know if you need anything.  

## 2018-12-02 ENCOUNTER — Other Ambulatory Visit: Payer: Self-pay | Admitting: Family Medicine

## 2018-12-02 ENCOUNTER — Encounter: Payer: Self-pay | Admitting: Family Medicine

## 2018-12-02 MED ORDER — AZITHROMYCIN 250 MG PO TABS: ORAL_TABLET | ORAL | 0 refills | Status: DC

## 2019-04-13 ENCOUNTER — Encounter: Payer: Self-pay | Admitting: Physician Assistant

## 2019-04-13 ENCOUNTER — Encounter (INDEPENDENT_AMBULATORY_CARE_PROVIDER_SITE_OTHER): Payer: Self-pay

## 2019-04-13 ENCOUNTER — Telehealth: Payer: BC Managed Care – PPO | Admitting: Physician Assistant

## 2019-04-13 ENCOUNTER — Telehealth: Payer: Self-pay | Admitting: *Deleted

## 2019-04-13 DIAGNOSIS — R05 Cough: Secondary | ICD-10-CM

## 2019-04-13 DIAGNOSIS — R059 Cough, unspecified: Secondary | ICD-10-CM

## 2019-04-13 DIAGNOSIS — G4489 Other headache syndrome: Secondary | ICD-10-CM

## 2019-04-13 DIAGNOSIS — Z20822 Contact with and (suspected) exposure to covid-19: Secondary | ICD-10-CM

## 2019-04-13 NOTE — Progress Notes (Signed)
E-Visit for Corona Virus Screening   Your current symptoms could be consistent with the coronavirus.  Call your health care provider or local health department to request and arrange formal testing. Many health care providers can now test patients at their office but not all are.  Please quarantine yourself while awaiting your test results.  [facility] [phone], [facility] [phone], [facility] [phone] or visit [email or web address]  and You have been enrolled in [facility] Monitoring for COVID-19.  Daily you will receive a questionnaire within the MyChart website. Our COVID-19 response team will be monitoring your responses daily.   Due to possible pregnancy, medication for cough was not prescribed.   I have provided a work note   COVID-19 is a respiratory illness with symptoms that are similar to the flu. Symptoms are typically mild to moderate, but there have been cases of severe illness and death due to the virus. The following symptoms may appear 2-14 days after exposure: . Fever . Cough . Shortness of breath or difficulty breathing . Chills . Repeated shaking with chills . Muscle pain . Headache . Sore throat . New loss of taste or smell . Fatigue . Congestion or runny nose . Nausea or vomiting . Diarrhea  It is vitally important that if you feel that you have an infection such as this virus or any other virus that you stay home and away from places where you may spread it to others.  You should self-quarantine for 14 days if you have symptoms that could potentially be coronavirus or have been in close contact a with a person diagnosed with COVID-19 within the last 2 weeks. You should avoid contact with people age 31 and older.   You should wear a mask or cloth face covering over your nose and mouth if you must be around other people or animals,  including pets (even at home). Try to stay at least 6 feet away from other people. This will protect the people around you.    You may also take acetaminophen (Tylenol) as needed for fever.   Reduce your risk of any infection by using the same precautions used for avoiding the common cold or flu:  Marland Kitchen. Wash your hands often with soap and warm water for at least 20 seconds.  If soap and water are not readily available, use an alcohol-based hand sanitizer with at least 60% alcohol.  . If coughing or sneezing, cover your mouth and nose by coughing or sneezing into the elbow areas of your shirt or coat, into a tissue or into your sleeve (not your hands). . Avoid shaking hands with others and consider head nods or verbal greetings only. . Avoid touching your eyes, nose, or mouth with unwashed hands.  . Avoid close contact with people who are sick. . Avoid places or events with large numbers of people in one location, like concerts or sporting events. . Carefully consider travel plans you have or are making. . If you are planning any travel outside or inside the [place], visit the CDC's Travelers' Health webpage for the latest health notices. . If you have some symptoms but not all symptoms, continue to monitor at home and seek medical attention if your symptoms worsen. . If you are having a medical emergency, call 911.  HOME CARE . Only take medications as instructed by your medical team. . Drink plenty of fluids and get plenty of rest. . A steam or ultrasonic humidifier can  help if you have congestion.   GET HELP RIGHT AWAY IF YOU HAVE EMERGENCY WARNING SIGNS** FOR COVID-19. If you or someone is showing any of these signs seek emergency medical care immediately. Call 911 or proceed to your closest emergency facility if: . You develop worsening high fever. . Trouble breathing . Bluish lips or face . Persistent pain or pressure in the chest . New confusion . Inability to wake or stay awake . You  cough up blood. . Your symptoms become more severe  **This list is not all possible symptoms. Contact your medical provider for any symptoms that are sever or concerning to you.   MAKE SURE YOU   Understand these instructions.  Will watch your condition.  Will get help right away if you are not doing well or get worse.  Your e-visit answers were reviewed by a board certified advanced clinical practitioner to complete your personal care plan.  Depending on the condition, your plan could have included both over the counter or prescription medications.  If there is a problem please reply once you have received a response from your provider.  Your safety is important to us.  If you have drug allergies check your prescription carefully.    You can use MyChart to ask questions about today's visit, request a non-urgent call back, or ask for a work or school excuse for 24 hours related to this e-Visit. If it has been greater than 24 hours you will need to follow up with your provider, or enter a new e-Visit to address those concerns. You will get an e-mail in the next two days asking about your experience.  I hope that your e-visit has been valuable and will speed your recovery. Thank you for using e-visits.   Based on what you shared with me, I feel your condition warrants further evaluation and I recommend that you be seen for a face to face office visit.  NOTE: If you entered your credit card information for this eVisit, you will not be charged. You may see a "hold" on your card for the $35 but that hold will drop off and you will not have a charge processed.  If you are having a true medical emergency please call 911.     For an urgent face to face visit, [place] has five urgent care centers for your convenience:    [email or web address] to reserve your spot online an avoid wait times  [facility] [address], [address] [place], [place] [address] Modified  hours of operation: Monday-Friday, 12 PM to 6 PM  Closed Saturday & Sunday  *Across the street from [employer]  [facility] (New Address!) [address], [address] [place], [place] [address] *Just off [address], across the road from [employer]* Modified hours of operation: Monday-Friday, 12 PM to 6 PM  Closed Saturday & Sunday   The following sites will take your insurance:  . [facility]    [phone]                  Get Driving Directions  [id number] [place], [place] [address] . 10 am to 8 pm Monday-Friday . 12 pm to 8 pm Saturday-Sunday   . [facility] [place]  [phone]                  Get Driving Directions  [id number] [place] [address] [place], [place] [address] . 8 am to  8 pm Monday-Friday . 9 am to 6 pm Saturday . 11 am to 6 pm Sunday   . [facility] at [place]                  Get Driving Directions   [address] [place], [place] [address] . 8 am to 8 pm Monday-Friday . 8 am to 4 pm Saturday-Sunday    . [facility] [place]                    Get Driving Directions  [phone]  [address], [place],  [address]  . Monday-Friday, 12 PM to 6 PM    Your e-visit answers were reviewed by a board certified advanced clinical practitioner to complete your personal care plan.  Thank you for using e-Visits.

## 2019-04-13 NOTE — Telephone Encounter (Signed)
Contacted pt due to my chart companion response of worsened cough on 04/13/2019; recommendations given:  7: COUGH MEDICINES:  * OTC COUGH SYRUPS: The most common cough suppressant in OTC cough medications is dextromethorphan. Often the letters 'DM' appear in the name.  * OTC COUGH DROPS: Cough drops can help a lot, especially for mild coughs. They reduce coughing by soothing your irritated throat and removing that tickle sensation in the back of the throat. Cough drops also have the advantage of portability - you can carry them with you.  * HOME REMEDY - HARD CANDY: Hard candy works just as well as medicine-flavored OTC cough drops. People who have diabetes should use sugar-free candy.  * HOME REMEDY - HONEY: This old home remedy has been shown to help decrease coughing at night. The adult dosage is 2 teaspoons (10 ml) at bedtime. Honey should not be given to infants under one year of age.    8: CAUTION - DEXTROMETHORPHAN:  * Do not try to completely suppress coughs that produce mucus and phlegm. Remember that coughing is helpful in bringing up mucus from the lungs and preventing pneumonia.  * Research Notes: Dextromethorphan in some research studies has been shown to reduce the frequency and severity of cough in adults (18 years or older) without significant adverse effects. However, other studies suggest that dextromethorphan is no better than placebo at reducing a cough.   Pt also advised to contact her PCP for further recommendations; she verbalized understanding.

## 2019-04-19 ENCOUNTER — Encounter (INDEPENDENT_AMBULATORY_CARE_PROVIDER_SITE_OTHER): Payer: Self-pay

## 2019-04-20 ENCOUNTER — Encounter (INDEPENDENT_AMBULATORY_CARE_PROVIDER_SITE_OTHER): Payer: Self-pay

## 2019-09-01 ENCOUNTER — Ambulatory Visit (INDEPENDENT_AMBULATORY_CARE_PROVIDER_SITE_OTHER)
Admission: RE | Admit: 2019-09-01 | Discharge: 2019-09-01 | Disposition: A | Discharge status: Home / Self Care | Payer: BC Managed Care – PPO | Source: Ambulatory Visit

## 2019-09-01 DIAGNOSIS — R059 Cough, unspecified: Secondary | ICD-10-CM

## 2019-09-01 DIAGNOSIS — R05 Cough: Secondary | ICD-10-CM

## 2019-09-01 DIAGNOSIS — J0100 Acute maxillary sinusitis, unspecified: Secondary | ICD-10-CM

## 2019-09-01 DIAGNOSIS — Z20828 Contact with and (suspected) exposure to other viral communicable diseases: Secondary | ICD-10-CM | POA: Diagnosis not present

## 2019-09-01 DIAGNOSIS — Z20822 Contact with and (suspected) exposure to covid-19: Secondary | ICD-10-CM

## 2019-09-01 MED ORDER — DOXYCYCLINE HYCLATE 100 MG PO CAPS: 100.0000 mg | ORAL_CAPSULE | Freq: Two times a day (BID) | ORAL | 0 refills | Status: DC

## 2019-09-01 MED ORDER — BENZONATATE 100 MG PO CAPS: 100.0000 mg | ORAL_CAPSULE | Freq: Three times a day (TID) | ORAL | 0 refills | Status: DC

## 2019-09-01 NOTE — ED Provider Notes (Signed)
Alicia Christensen    Virtual Visit via Video Note:  CAROLLEE NUSSBAUMER  initiated request for Telemedicine visit with [facility] team. I connected with Alicia Christensen  on 09/01/2019 at 4:11 PM  for a synchronized telemedicine visit using a video enabled HIPPA compliant telemedicine application. I verified that I am speaking with Alicia Christensen  using two identifiers. [name], PA-C  was physically located in a [facility] Urgent care site and Alicia PRESSEY was located at a different location.   The limitations of evaluation and management by telemedicine as well as the availability of in-person appointments were discussed. Patient was informed that she  may incur a bill ( including co-pay) for this virtual visit encounter. ARIANIE COUSE  expressed understanding and gave verbal consent to proceed with virtual visit.  [id number] 09/01/19 Arrival Time: 7169  Cc: COUGH  SUBJECTIVE:  Alicia Christensen is a 33 y.o. female who presents with nasal congestion, maxillary sinus pain/ pressure, PND, and productive cough with green/ yellow sputum x 4-5 days, with worsening symptoms over the past 2 days.  Denies sick exposure to COVID, flu or strep.  Denies recent travel.   However, does work as a Education officer, museum.  Has tried OTC claritin with minimal relief.  Denies aggravating factors.  Reports previous symptoms in the past related to sinus infection.  Complains of mild chills, and chest tightness related to persistent cough.  Denies fever, fatigue, rhinorrhea, sore throat, SOB, wheezing, chest pain, nausea, changes in bowel or bladder habits.    ROS: As per HPI.  All other pertinent ROS negative.     Past Medical History:  Diagnosis Date  . Environmental allergies   . Frequent headaches   . Hx of varicella   . Migraine   . Seasonal allergies   . Vaginal Pap smear, abnormal    Past Surgical History:  Procedure Laterality Date  . CESAREAN SECTION N/A 01/15/2016   Procedure: CESAREAN SECTION;  Surgeon: [name], MD;  Location: [place] [facility];  Service: Obstetrics;  Laterality: N/A;  . DILATION AND CURETTAGE OF UTERUS    . WISDOM TOOTH EXTRACTION     Allergies  Allergen Reactions  . Amoxicillin Nausea And Vomiting  . Hydrocodeine [Dihydrocodeine] Nausea And Vomiting  . Penicillins Nausea And Vomiting    Has patient had a PCN reaction causing immediate rash, facial/tongue/throat swelling, SOB or lightheadedness with hypotension: No Has patient had a PCN reaction causing severe rash involving mucus membranes or skin necrosis: No Has patient had a PCN reaction that required hospitalization No Has patient had a PCN reaction occurring within the last 10 years: No If all of the above answers are "NO", then may proceed with Cephalosporin use.    No current facility-administered medications on file prior to encounter.    Current Outpatient Medications on File Prior to Encounter  Medication Sig Dispense Refill  . loratadine (CLARITIN) 10 MG tablet Take 1 tablet (10 mg total) by mouth daily. 30 tablet 0  . [DISCONTINUED] fluticasone (FLONASE) 50 MCG/ACT nasal spray One twice daily each nostril (Patient taking differently: Place 1 spray into the nose 2 (two) times daily. One twice daily each nostril)        OBJECTIVE:  There were no vitals filed for this visit.   General appearance: alert; fatigued appearing, but nontoxic Eyes: EOMI grossly HENT: normocephalic; atraumatic; points to maxillary sinuses as TTP; tolerating own secretions without difficulty Neck: supple with FROM Lungs: normal respiratory  effort; speaking in full sentences without difficulty; mild to moderate cough present Extremities: moves extremities without difficulty Skin: No obvious rashes Neurologic: No facial asymmetries; no slurred speech Psychological: alert and cooperative; normal mood and affect   ASSESSMENT & PLAN:  1. Encounter by telehealth for suspected COVID-19   2.  Acute non-recurrent maxillary sinusitis   3. Cough     Meds ordered this encounter  Medications  . doxycycline (VIBRAMYCIN) 100 MG capsule    Sig: Take 1 capsule (100 mg total) by mouth 2 (two) times daily.    Dispense:  20 capsule    Refill:  0    Order Specific Question:   Supervising Provider    Answer:   [name] [[id number]]  . benzonatate (TESSALON) 100 MG capsule    Sig: Take 1 capsule (100 mg total) by mouth every 8 (eight) hours.    Dispense:  21 capsule    Refill:  0    Order Specific Question:   Supervising Provider    Answer:   [name] [[id number]]    No orders of the defined types were placed in this encounter.   COVID testing ordered. Go to [address] in [place] for drive-thru testing.  They are open M-F 8-4 pm daily  In the meantime: You should remain isolated in your home for 10 days from symptom onset AND greater than 72 hours after symptoms resolution (absence of fever without the use of fever-reducing medication and improvement in respiratory symptoms), whichever is longer Get plenty of rest and push fluids Tessalon Perles prescribed for cough Doxycycline prescribed for possible sinus infection.  Take as directed and to completion Use OTC medications like ibuprofen or tylenol as needed fever or pain Follow up in person, call or go to the ED if you have any new or worsening symptoms such as fever, worsening cough, shortness of breath, chest tightness, chest pain, turning blue, changes in mental status, symptoms do not improve with medications, etc...   I discussed the assessment and treatment plan with the patient. The patient was provided an opportunity to ask questions and all were answered. The patient agreed with the plan and demonstrated an understanding of the instructions.   The patient was advised to call back or seek an in-person evaluation if the symptoms worsen or if the condition fails to improve as anticipated.  I provided  10 minutes of non-face-to-face time during this encounter.  Alicia Harding, PA-C  09/01/2019 4:11 PM           Alicia Harding, PA-C 09/01/19 1612

## 2019-09-01 NOTE — Discharge Instructions (Signed)
COVID testing ordered. Go to [facility] in [place] for drive-thru testing.  They are open M-F 8-4 pm daily  In the meantime: You should remain isolated in your home for 10 days from symptom onset AND greater than 72 hours after symptoms resolution (absence of fever without the use of fever-reducing medication and improvement in respiratory symptoms), whichever is longer Get plenty of rest and push fluids Tessalon Perles prescribed for cough Doxycycline prescribed for possible sinus infection.  Take as directed and to completion Use OTC medications like ibuprofen or tylenol as needed fever or pain Follow up in person, call or go to the ED if you have any new or worsening symptoms such as fever, worsening cough, shortness of breath, chest tightness, chest pain, turning blue, changes in mental status, symptoms do not improve with medications, etc..Marland Kitchen

## 2019-09-02 ENCOUNTER — Other Ambulatory Visit: Payer: Self-pay

## 2019-09-02 DIAGNOSIS — Z20822 Contact with and (suspected) exposure to covid-19: Secondary | ICD-10-CM

## 2019-09-03 ENCOUNTER — Encounter: Payer: Self-pay | Admitting: Family Medicine

## 2019-09-06 LAB — NOVEL CORONAVIRUS, NAA: SARS-CoV-2, NAA: DETECTED — AB

## 2019-09-07 ENCOUNTER — Ambulatory Visit: Payer: Self-pay

## 2019-09-07 NOTE — Telephone Encounter (Signed)
Provided covid lab results to Patient.  Provided care advice.  Voiced care advice.  .  Patient voiced  Understanding.

## 2019-09-09 ENCOUNTER — Encounter (INDEPENDENT_AMBULATORY_CARE_PROVIDER_SITE_OTHER): Payer: Self-pay

## 2019-09-09 ENCOUNTER — Telehealth: Payer: Self-pay

## 2019-09-09 ENCOUNTER — Telehealth: Payer: BC Managed Care – PPO | Admitting: Family

## 2019-09-09 ENCOUNTER — Encounter (HOSPITAL_COMMUNITY): Payer: Self-pay

## 2019-09-09 DIAGNOSIS — U071 COVID-19: Secondary | ICD-10-CM | POA: Diagnosis not present

## 2019-09-09 MED ORDER — PROMETHAZINE-DM 6.25-15 MG/5ML PO SYRP: 5.0000 mL | ORAL_SOLUTION | Freq: Three times a day (TID) | ORAL | 0 refills | Status: DC | PRN

## 2019-09-09 MED ORDER — PREDNISONE 10 MG (21) PO TBPK: ORAL_TABLET | ORAL | 0 refills | Status: DC

## 2019-09-09 MED ORDER — ALBUTEROL SULFATE HFA 108 (90 BASE) MCG/ACT IN AERS: 2.0000 | INHALATION_SPRAY | Freq: Four times a day (QID) | RESPIRATORY_TRACT | 0 refills | Status: DC | PRN

## 2019-09-09 NOTE — Telephone Encounter (Signed)
Patient advise on cough per protocol:  If cough remains the same or better: continue to treat with over the counter medications. Hard candy or cough drops and drinking warm fluids. Adults can also use honey 2 tsp (10 ML) at bedtime.   HONEY IS NOT RECOMMENDED FOR INFANTS UNDER ONE.  If cough is becoming worse even with the use of over the counter medications and patient is not able to sleep at night, cough becomes productive with sputum that maybe yellow or green in color, contact PCP.  Patient verbalized understanding and agrees with plan.     

## 2019-09-09 NOTE — Progress Notes (Signed)
E-Visit for Corona Virus Screening   Since you are COVID positive we are enrolling you in our [place] for [place] . Daily you will receive a questionnaire within the [place] website. Our COVID 19 response team willl be monitoriing your responses daily. Please continue good preventive care measures, including:  frequent hand-washing, avoid touching your face, cover coughs/sneezes, stay out of crowds and keep a 6 foot distance from others.    COVID-19 is a respiratory illness with symptoms that are similar to the flu. Symptoms are typically mild to moderate, but there have been cases of severe illness and death due to the virus. The following symptoms may appear 2-14 days after exposure: . Fever . Cough . Shortness of breath or difficulty breathing . Chills . Repeated shaking with chills . Muscle pain . Headache . Sore throat . New loss of taste or smell . Fatigue . Congestion or runny nose . Nausea or vomiting . Diarrhea  If you develop fever/cough/breathlessness, please stay home for 10 days with improving symptoms and until you have had 24 hours of no fever (without taking a fever reducer).  Go to the nearest hospital ED for assessment if fever/cough/breathlessness are severe or illness seems like a threat to life.  It is vitally important that if you feel that you have an infection such as this virus or any other virus that you stay home and away from places where you may spread it to others.  You should avoid contact with people age 46 and older.   You should wear a mask or cloth face covering over your nose and mouth if you must be around other people or animals, including pets (even at home). Try to stay at least 6 feet away from other people. This will protect the people around you.  You can use medication such as A prescription inhaler called Albuterol MDI 90 mcg /actuation 2 puffs every 4 hours as needed for shortness of breath, wheezing, cough and A prescription cough  medication called Phenergan DM 6.25 mg/15 mg. You make take one teaspoon / 5 ml every 4-6 hours as needed for cough, and prednisone dose pack.   If your symptoms worsen, you need to be seen face to face over the next 1-2 days.   You may also take acetaminophen (Tylenol) as needed for fever.   Reduce your risk of any infection by using the same precautions used for avoiding the common cold or flu:  Marland Kitchen Wash your hands often with soap and warm water for at least 20 seconds.  If soap and water are not readily available, use an alcohol-based hand sanitizer with at least 60% alcohol.  . If coughing or sneezing, cover your mouth and nose by coughing or sneezing into the elbow areas of your shirt or coat, into a tissue or into your sleeve (not your hands). . Avoid shaking hands with others and consider head nods or verbal greetings only. . Avoid touching your eyes, nose, or mouth with unwashed hands.  . Avoid close contact with people who are sick. . Avoid places or events with large numbers of people in one location, like concerts or sporting events. . Carefully consider travel plans you have or are making. . If you are planning any travel outside or inside the [place], visit the CDC's Travelers' Health webpage for the latest health notices. . If you have some symptoms but not all symptoms, continue to monitor at home and seek medical attention if your symptoms worsen. Marland Kitchen  If you are having a medical emergency, call 911.  HOME CARE . Only take medications as instructed by your medical team. . Drink plenty of fluids and get plenty of rest. . A steam or ultrasonic humidifier can help if you have congestion.   GET HELP RIGHT AWAY IF YOU HAVE EMERGENCY WARNING SIGNS** FOR COVID-19. If you or someone is showing any of these signs seek emergency medical care immediately. Call 911 or proceed to your closest emergency facility if: . You develop worsening high fever. . Trouble breathing . Bluish lips or  face . Persistent pain or pressure in the chest . New confusion . Inability to wake or stay awake . You cough up blood. . Your symptoms become more severe  **This list is not all possible symptoms. Contact your medical provider for any symptoms that are sever or concerning to you.   MAKE SURE YOU   Understand these instructions.  Will watch your condition.  Will get help right away if you are not doing well or get worse.  Your e-visit answers were reviewed by a board certified advanced clinical practitioner to complete your personal care plan.  Depending on the condition, your plan could have included both over the counter or prescription medications.  If there is a problem please reply once you have received a response from your provider.  Your safety is important to [place].  If you have drug allergies check your prescription carefully.    You can use MyChart to ask questions about today's visit, request a non-urgent call back, or ask for a work or school excuse for 24 hours related to this e-Visit. If it has been greater than 24 hours you will need to follow up with your provider, or enter a new e-Visit to address those concerns. You will get an e-mail in the next two days asking about your experience.  I hope that your e-visit has been valuable and will speed your recovery. Thank you for using e-visits.   Approximately 5 minutes was spent documenting and reviewing patient's chart.

## 2019-09-24 ENCOUNTER — Encounter: Payer: Self-pay | Admitting: Family Medicine

## 2019-09-28 ENCOUNTER — Ambulatory Visit (INDEPENDENT_AMBULATORY_CARE_PROVIDER_SITE_OTHER): Payer: BC Managed Care – PPO | Admitting: Internal Medicine

## 2019-09-28 DIAGNOSIS — U071 COVID-19: Secondary | ICD-10-CM | POA: Diagnosis not present

## 2019-09-28 DIAGNOSIS — R059 Cough, unspecified: Secondary | ICD-10-CM

## 2019-09-28 DIAGNOSIS — R05 Cough: Secondary | ICD-10-CM | POA: Diagnosis not present

## 2019-09-28 MED ORDER — ALBUTEROL SULFATE HFA 108 (90 BASE) MCG/ACT IN AERS: 2.0000 | INHALATION_SPRAY | Freq: Four times a day (QID) | RESPIRATORY_TRACT | 0 refills | Status: DC | PRN

## 2019-09-28 NOTE — Progress Notes (Signed)
Subjective:    Patient ID: Alicia Christensen, female    DOB: [birth year], 33 y.o.   MRN: [id number]  DOS:  09/28/2019 Type of visit - description: Virtual Visit via Video Note  I connected with the above patient  by a video enabled telemedicine application and verified that I am speaking with the correct person using two identifiers.   THIS ENCOUNTER IS A VIRTUAL VISIT DUE TO COVID-19 - PATIENT WAS NOT SEEN IN THE OFFICE. PATIENT HAS CONSENTED TO VIRTUAL VISIT / TELEMEDICINE VISIT   Location of patient: home  Location of provider: office  I discussed the limitations of evaluation and management by telemedicine and the availability of in person appointments. The patient expressed understanding and agreed to proceed.  History of Present Illness: Acute The patient tested positive for Covid 09/02/2019. At the time she had chest pain, difficulty breathing, nausea, vomiting, diarrhea. Also cough and a yellow sputum. She was seen in urgent care, was prescribed doxycycline and Tessalon Perles which she cannot tolerate Subsequently had a E-visit 09/09/2019, was prescribed prednisone which she took.  She was also prescribed albuterol but apparently she did not know and did not pick it up.   She calls today because she has a persistent cough with mild clear sputum production. Although the symptoms are much improved, did report some mild wheezing and chest tightness.   Review of Systems She actually never had fever. Denies edema, no difficulty breathing. No history of asthma previously Did have a headache last week but that is largely resolved LMP approximately 3 and half weeks ago.   Past Medical History:  Diagnosis Date  . Environmental allergies   . Frequent headaches   . Hx of varicella   . Migraine   . Seasonal allergies   . Vaginal Pap smear, abnormal     Past Surgical History:  Procedure Laterality Date  . CESAREAN SECTION N/A 01/15/2016   Procedure: CESAREAN SECTION;   Surgeon: [name], MD;  Location: [facility];  Service: Obstetrics;  Laterality: N/A;  . DILATION AND CURETTAGE OF UTERUS    . WISDOM TOOTH EXTRACTION      Social History   Socioeconomic History  . Marital status: Married    Spouse name: Not on file  . Number of children: Not on file  . Years of education: Not on file  . Highest education level: Not on file  Occupational History  . Not on file  Tobacco Use  . Smoking status: Never Smoker  . Smokeless tobacco: Never Used  Substance and Sexual Activity  . Alcohol use: Yes    Alcohol/week: 0.0 standard drinks    Comment: rare -- 3 x month  . Drug use: No  . Sexual activity: Yes    Partners: Male  Other Topics Concern  . Not on file  Social History Narrative  . Not on file   Social Determinants of Health   Financial Resource Strain:   . Difficulty of Paying Living Expenses: Not on file  Food Insecurity:   . Worried About Charity fundraiser in the Last Year: Not on file  . Ran Out of Food in the Last Year: Not on file  Transportation Needs:   . Lack of Transportation (Medical): Not on file  . Lack of Transportation (Non-Medical): Not on file  Physical Activity:   . Days of Exercise per Week: Not on file  . Minutes of Exercise per Session: Not on file  Stress:   . Feeling of  Stress : Not on file  Social Connections:   . Frequency of Communication with Friends and Family: Not on file  . Frequency of Social Gatherings with Friends and Family: Not on file  . Attends Religious Services: Not on file  . Active Member of Clubs or Organizations: Not on file  . Attends Banker Meetings: Not on file  . Marital Status: Not on file  Intimate Partner Violence:   . Fear of Current or Ex-Partner: Not on file  . Emotionally Abused: Not on file  . Physically Abused: Not on file  . Sexually Abused: Not on file      Allergies as of 09/28/2019      Reactions   Amoxicillin Nausea And Vomiting   Hydrocodeine  [dihydrocodeine] Nausea And Vomiting   Penicillins Nausea And Vomiting   Has patient had a PCN reaction causing immediate rash, facial/tongue/throat swelling, SOB or lightheadedness with hypotension: No Has patient had a PCN reaction causing severe rash involving mucus membranes or skin necrosis: No Has patient had a PCN reaction that required hospitalization No Has patient had a PCN reaction occurring within the last 10 years: No If all of the above answers are "NO", then may proceed with Cephalosporin use.      Medication List       Accurate as of September 28, 2019  3:15 PM. If you have any questions, ask your nurse or doctor.        albuterol 108 (90 Base) MCG/ACT inhaler Commonly known as: VENTOLIN HFA Inhale 2 puffs into the lungs every 6 (six) hours as needed for wheezing or shortness of breath.   benzonatate 100 MG capsule Commonly known as: TESSALON Take 1 capsule (100 mg total) by mouth every 8 (eight) hours.   doxycycline 100 MG capsule Commonly known as: VIBRAMYCIN Take 1 capsule (100 mg total) by mouth 2 (two) times daily.   loratadine 10 MG tablet Commonly known as: CLARITIN Take 1 tablet (10 mg total) by mouth daily.   predniSONE 10 MG (21) Tbpk tablet Commonly known as: STERAPRED UNI-PAK 21 TAB Use as directed   promethazine-dextromethorphan 6.25-15 MG/5ML syrup Commonly known as: PROMETHAZINE-DM Take 5 mLs by mouth 3 (three) times daily as needed for cough.           Objective:   Physical Exam There were no vitals taken for this visit. This is a virtual video visit, she is alert oriented x3, in no apparent distress.    Assessment    COVID-41 33 year old female, with no major medical problems, not on birth control, presenting with persistent mild cough   since she was dx w/ COVID-19 09/02/2019. She has noticed some wheezing. she is intolerant to [employer] Plan: Robitussin-DM or Mucinex DM. Sent a prescription for albuterol, 2 puffs every 6  hours as needed, how to use discussed with the patient. If she is not gradually better in the next 2 weeks she is to let me know. We talked about possibly doing an x-ray but we agreed to wait and see. All questions were answered to her satisfaction.      I discussed the assessment and treatment plan with the patient. The patient was provided an opportunity to ask questions and all were answered. The patient agreed with the plan and demonstrated an understanding of the instructions.   The patient was advised to call back or seek an in-person evaluation if the symptoms worsen or if the condition fails to improve as anticipated.

## 2020-11-15 ENCOUNTER — Telehealth (INDEPENDENT_AMBULATORY_CARE_PROVIDER_SITE_OTHER): Payer: BC Managed Care – PPO | Admitting: Family Medicine

## 2020-11-15 ENCOUNTER — Other Ambulatory Visit: Payer: Self-pay

## 2020-11-15 ENCOUNTER — Encounter: Payer: Self-pay | Admitting: Family Medicine

## 2020-11-15 DIAGNOSIS — U071 COVID-19: Secondary | ICD-10-CM

## 2020-11-15 MED ORDER — PROMETHAZINE-DM 6.25-15 MG/5ML PO SYRP: 5.0000 mL | ORAL_SOLUTION | Freq: Three times a day (TID) | ORAL | 0 refills | Status: DC | PRN

## 2020-11-15 MED ORDER — CETIRIZINE HCL 10 MG PO TABS: 10.0000 mg | ORAL_TABLET | Freq: Every day | ORAL | 0 refills | Status: DC

## 2020-11-15 MED ORDER — FLOVENT HFA 110 MCG/ACT IN AERO: 2.0000 | INHALATION_SPRAY | Freq: Two times a day (BID) | RESPIRATORY_TRACT | 1 refills | Status: DC

## 2020-11-15 NOTE — Progress Notes (Signed)
Chief Complaint  Patient presents with  . Cough  . Sinusitis    Alicia Christensen here for URI complaints. Due to COVID-19 pandemic, we are interacting via web portal for an electronic face-to-face visit. I verified patient's ID using 2 identifiers. Patient agreed to proceed with visit via this method. Patient is at home, I am at office. Patient and I are present for visit.   Duration: 3 weeks  Associated symptoms: cough, lost voice, sinus pain and congestion Denies: rhinorrhea, itchy watery eyes, ear pain, ear drainage, sore throat, wheezing, shortness of breath and fevers, N/V/D Treatment to date: Mucinex, Phenegan DM Sick contacts: Yes - family She tested + for covid 3 weeks ago. She is [redacted] weeks pregnant.   Past Medical History:  Diagnosis Date  . Environmental allergies   . Frequent headaches   . Hx of varicella   . Migraine   . Seasonal allergies   . Vaginal Pap smear, abnormal    Exam No conversational dyspnea Age appropriate judgment and insight Nml affect and mood  COVID-19 virus detected - Plan: promethazine-dextromethorphan (PROMETHAZINE-DM) 6.25-15 MG/5ML syrup, fluticasone (FLOVENT HFA) 110 MCG/ACT inhaler, cetirizine (ZYRTEC) 10 MG tablet  Refill syrup. Warnings about drowsiness verbalized. ICS and INCS + Zyrtec for cough/drainage respectively.  Continue to push fluids, practice good hand hygiene, cover mouth when coughing. F/u prn. If starting to experience fevers, shaking, or shortness of breath, seek immediate care. Pt voiced understanding and agreement to the plan.  Jilda Roche [place], DO 11/15/20 3:24 PM

## 2021-04-10 ENCOUNTER — Other Ambulatory Visit: Payer: Self-pay | Admitting: Obstetrics and Gynecology

## 2021-04-11 NOTE — Patient Instructions (Signed)
Alicia Christensen  04/11/2021   Your procedure is scheduled on:  04/19/2021  Arrive at 0830 at Entrance C on [address] at [facility]  and [address]. You are invited to use the FREE valet parking or use the Visitor's parking deck.  Pick up the phone at the desk and dial [phone].  Call this number if you have problems the morning of surgery: [phone]  Remember:   Do not eat food:(After Midnight) Desps de medianoche.  Do not drink clear liquids: (After Midnight) Desps de medianoche.  Take these medicines the morning of surgery with A SIP OF WATER:  none   Do not wear jewelry, make-up or nail polish.  Do not wear lotions, powders, or perfumes. Do not wear deodorant.  Do not shave 48 hours prior to surgery.  Do not bring valuables to the hospital.  [facility] is not   responsible for any belongings or valuables brought to the hospital.  Contacts, dentures or bridgework may not be worn into surgery.  Leave suitcase in the car. After surgery it may be brought to your room.  For patients admitted to the hospital, checkout time is 11:00 AM the day of              discharge.      Please read over the following fact sheets that you were given:     Preparing for Surgery

## 2021-04-13 ENCOUNTER — Encounter (HOSPITAL_COMMUNITY): Payer: Self-pay

## 2021-04-18 ENCOUNTER — Encounter (HOSPITAL_COMMUNITY)
Admission: RE | Admit: 2021-04-18 | Discharge: 2021-04-18 | Disposition: A | Discharge status: Home / Self Care | Payer: BC Managed Care – PPO | Source: Ambulatory Visit | Attending: Obstetrics and Gynecology | Admitting: Obstetrics and Gynecology

## 2021-04-18 ENCOUNTER — Other Ambulatory Visit (HOSPITAL_COMMUNITY)
Admission: RE | Admit: 2021-04-18 | Discharge: 2021-04-18 | Disposition: A | Discharge status: Home / Self Care | Payer: BC Managed Care – PPO | Source: Ambulatory Visit | Attending: Obstetrics and Gynecology | Admitting: Obstetrics and Gynecology

## 2021-04-18 ENCOUNTER — Encounter (HOSPITAL_COMMUNITY): Payer: Self-pay | Admitting: Obstetrics and Gynecology

## 2021-04-18 ENCOUNTER — Other Ambulatory Visit: Payer: Self-pay

## 2021-04-18 DIAGNOSIS — Z01812 Encounter for preprocedural laboratory examination: Secondary | ICD-10-CM | POA: Insufficient documentation

## 2021-04-18 DIAGNOSIS — Z20822 Contact with and (suspected) exposure to covid-19: Secondary | ICD-10-CM | POA: Insufficient documentation

## 2021-04-18 HISTORY — DX: Gestational diabetes mellitus in pregnancy, unspecified control: O24.419

## 2021-04-18 LAB — CBC
HCT: 34 % — ABNORMAL LOW (ref 36.0–46.0)
Hemoglobin: 10.6 g/dL — ABNORMAL LOW (ref 12.0–15.0)
MCH: 25.3 pg — ABNORMAL LOW (ref 26.0–34.0)
MCHC: 31.2 g/dL (ref 30.0–36.0)
MCV: 81.1 fL (ref 80.0–100.0)
Platelets: 215 10*3/uL (ref 150–400)
RBC: 4.19 MIL/uL (ref 3.87–5.11)
RDW: 17.2 % — ABNORMAL HIGH (ref 11.5–15.5)
WBC: 9.1 10*3/uL (ref 4.0–10.5)
nRBC: 0 % (ref 0.0–0.2)

## 2021-04-18 LAB — TYPE AND SCREEN
ABO/RH(D): O POS
Antibody Screen: NEGATIVE

## 2021-04-19 ENCOUNTER — Inpatient Hospital Stay (HOSPITAL_COMMUNITY): Payer: BC Managed Care – PPO | Admitting: Anesthesiology

## 2021-04-19 ENCOUNTER — Encounter (HOSPITAL_COMMUNITY)
Admission: RE | Disposition: A | Discharge status: Home / Self Care | Payer: Self-pay | Source: Home / Self Care | Attending: Obstetrics and Gynecology

## 2021-04-19 ENCOUNTER — Encounter (HOSPITAL_COMMUNITY): Payer: Self-pay | Admitting: Obstetrics and Gynecology

## 2021-04-19 ENCOUNTER — Other Ambulatory Visit: Payer: Self-pay

## 2021-04-19 ENCOUNTER — Inpatient Hospital Stay (HOSPITAL_COMMUNITY)
Admission: RE | Admit: 2021-04-19 | Discharge: 2021-04-21 | DRG: 786 | Disposition: A | Discharge status: Home / Self Care | Payer: BC Managed Care – PPO | Attending: Obstetrics and Gynecology | Admitting: Obstetrics and Gynecology

## 2021-04-19 DIAGNOSIS — O2412 Pre-existing diabetes mellitus, type 2, in childbirth: Secondary | ICD-10-CM | POA: Diagnosis present

## 2021-04-19 DIAGNOSIS — O34219 Maternal care for unspecified type scar from previous cesarean delivery: Secondary | ICD-10-CM | POA: Diagnosis present

## 2021-04-19 DIAGNOSIS — O24415 Gestational diabetes mellitus in pregnancy, controlled by oral hypoglycemic drugs: Secondary | ICD-10-CM | POA: Diagnosis present

## 2021-04-19 DIAGNOSIS — Z7984 Long term (current) use of oral hypoglycemic drugs: Secondary | ICD-10-CM

## 2021-04-19 DIAGNOSIS — Z20822 Contact with and (suspected) exposure to covid-19: Secondary | ICD-10-CM | POA: Diagnosis present

## 2021-04-19 DIAGNOSIS — O34211 Maternal care for low transverse scar from previous cesarean delivery: Principal | ICD-10-CM | POA: Diagnosis present

## 2021-04-19 DIAGNOSIS — O322XX0 Maternal care for transverse and oblique lie, not applicable or unspecified: Secondary | ICD-10-CM | POA: Diagnosis present

## 2021-04-19 DIAGNOSIS — Z880 Allergy status to penicillin: Secondary | ICD-10-CM | POA: Diagnosis not present

## 2021-04-19 DIAGNOSIS — O403XX0 Polyhydramnios, third trimester, not applicable or unspecified: Secondary | ICD-10-CM | POA: Diagnosis present

## 2021-04-19 DIAGNOSIS — E1165 Type 2 diabetes mellitus with hyperglycemia: Secondary | ICD-10-CM | POA: Diagnosis present

## 2021-04-19 DIAGNOSIS — Z3A38 38 weeks gestation of pregnancy: Secondary | ICD-10-CM

## 2021-04-19 DIAGNOSIS — O99214 Obesity complicating childbirth: Secondary | ICD-10-CM | POA: Diagnosis present

## 2021-04-19 DIAGNOSIS — O24419 Gestational diabetes mellitus in pregnancy, unspecified control: Secondary | ICD-10-CM

## 2021-04-19 DIAGNOSIS — O9902 Anemia complicating childbirth: Secondary | ICD-10-CM | POA: Diagnosis not present

## 2021-04-19 DIAGNOSIS — Z98891 History of uterine scar from previous surgery: Secondary | ICD-10-CM

## 2021-04-19 LAB — GLUCOSE, CAPILLARY
Glucose-Capillary: 127 mg/dL — ABNORMAL HIGH (ref 70–99)
Glucose-Capillary: 101 mg/dL — ABNORMAL HIGH (ref 70–99)

## 2021-04-19 LAB — RESP PANEL BY RT-PCR (FLU A&B, COVID) ARPGX2
Influenza A by PCR: NEGATIVE
Influenza B by PCR: NEGATIVE
SARS Coronavirus 2 by RT PCR: NEGATIVE

## 2021-04-19 LAB — SARS CORONAVIRUS 2 (TAT 6-24 HRS): SARS Coronavirus 2: NEGATIVE

## 2021-04-19 LAB — RPR: RPR Ser Ql: NONREACTIVE

## 2021-04-19 SURGERY — Surgical Case
Anesthesia: Spinal

## 2021-04-19 MED ORDER — MENTHOL 3 MG MT LOZG: 1.0000 | LOZENGE | OROMUCOSAL | Status: DC | PRN

## 2021-04-19 MED ORDER — OXYCODONE HCL 5 MG PO TABS: 5.0000 mg | ORAL_TABLET | Freq: Once | ORAL | Status: DC | PRN

## 2021-04-19 MED ORDER — FAMOTIDINE 20 MG PO TABS
ORAL_TABLET | ORAL | Status: AC
  Filled 2021-04-19: qty 1

## 2021-04-19 MED ORDER — METHYLERGONOVINE MALEATE 0.2 MG/ML IJ SOLN: 0.2000 mg | INTRAMUSCULAR | Status: DC | PRN

## 2021-04-19 MED ORDER — SIMETHICONE 80 MG PO CHEW
80.0000 mg | CHEWABLE_TABLET | Freq: Three times a day (TID) | ORAL | Status: DC
  Administered 2021-04-19 – 2021-04-21 (×6): 80 mg via ORAL
  Filled 2021-04-19 (×6): qty 1

## 2021-04-19 MED ORDER — BUPIVACAINE IN DEXTROSE 0.75-8.25 % IT SOLN
INTRATHECAL | Status: DC | PRN
  Administered 2021-04-19: 1.6 mL via INTRATHECAL

## 2021-04-19 MED ORDER — STERILE WATER FOR IRRIGATION IR SOLN
Status: DC | PRN
  Administered 2021-04-19: 1000 mL

## 2021-04-19 MED ORDER — SCOPOLAMINE 1 MG/3DAYS TD PT72: 1.0000 | MEDICATED_PATCH | Freq: Once | TRANSDERMAL | Status: DC

## 2021-04-19 MED ORDER — BUPIVACAINE HCL (PF) 0.25 % IJ SOLN
INTRAMUSCULAR | Status: DC | PRN
  Administered 2021-04-19: 20 mL

## 2021-04-19 MED ORDER — SIMETHICONE 80 MG PO CHEW
80.0000 mg | CHEWABLE_TABLET | ORAL | Status: DC | PRN
Start: 2021-04-19 — End: 2021-04-21

## 2021-04-19 MED ORDER — CHLORHEXIDINE GLUCONATE 0.12 % MT SOLN
15.0000 mL | Freq: Once | OROMUCOSAL | Status: AC
  Administered 2021-04-19: 15 mL via OROMUCOSAL

## 2021-04-19 MED ORDER — PHENYLEPHRINE HCL-NACL 20-0.9 MG/250ML-% IV SOLN
INTRAVENOUS | Status: DC | PRN
  Administered 2021-04-19: 30 ug/min via INTRAVENOUS

## 2021-04-19 MED ORDER — MORPHINE SULFATE (PF) 0.5 MG/ML IJ SOLN
INTRAMUSCULAR | Status: AC
  Filled 2021-04-19: qty 10

## 2021-04-19 MED ORDER — SOD CITRATE-CITRIC ACID 500-334 MG/5ML PO SOLN
ORAL | Status: AC
  Filled 2021-04-19: qty 30

## 2021-04-19 MED ORDER — FAMOTIDINE 20 MG PO TABS
20.0000 mg | ORAL_TABLET | Freq: Once | ORAL | Status: AC
  Administered 2021-04-19: 20 mg via ORAL

## 2021-04-19 MED ORDER — LACTATED RINGERS IV SOLN: INTRAVENOUS | Status: DC | PRN

## 2021-04-19 MED ORDER — ZOLPIDEM TARTRATE 5 MG PO TABS: 5.0000 mg | ORAL_TABLET | Freq: Every evening | ORAL | Status: DC | PRN

## 2021-04-19 MED ORDER — NALOXONE HCL 4 MG/10ML IJ SOLN
1.0000 ug/kg/h | INTRAVENOUS | Status: DC | PRN
  Filled 2021-04-19: qty 5

## 2021-04-19 MED ORDER — SODIUM CHLORIDE 0.9% FLUSH: 3.0000 mL | INTRAVENOUS | Status: DC | PRN

## 2021-04-19 MED ORDER — PHENYLEPHRINE HCL-NACL 20-0.9 MG/250ML-% IV SOLN
INTRAVENOUS | Status: AC
  Filled 2021-04-19: qty 250

## 2021-04-19 MED ORDER — NALOXONE HCL 0.4 MG/ML IJ SOLN: 0.4000 mg | INTRAMUSCULAR | Status: DC | PRN

## 2021-04-19 MED ORDER — MEPERIDINE HCL 25 MG/ML IJ SOLN: 6.2500 mg | INTRAMUSCULAR | Status: DC | PRN

## 2021-04-19 MED ORDER — LACTATED RINGERS IV SOLN: INTRAVENOUS | Status: DC

## 2021-04-19 MED ORDER — FENTANYL CITRATE (PF) 100 MCG/2ML IJ SOLN
INTRAMUSCULAR | Status: AC
  Filled 2021-04-19: qty 2

## 2021-04-19 MED ORDER — NALBUPHINE HCL 10 MG/ML IJ SOLN: 5.0000 mg | INTRAMUSCULAR | Status: DC | PRN

## 2021-04-19 MED ORDER — OXYTOCIN-SODIUM CHLORIDE 30-0.9 UT/500ML-% IV SOLN
INTRAVENOUS | Status: AC
  Filled 2021-04-19: qty 500

## 2021-04-19 MED ORDER — DIPHENHYDRAMINE HCL 25 MG PO CAPS: 25.0000 mg | ORAL_CAPSULE | ORAL | Status: DC | PRN

## 2021-04-19 MED ORDER — BUPIVACAINE HCL (PF) 0.25 % IJ SOLN
INTRAMUSCULAR | Status: AC
  Filled 2021-04-19: qty 20

## 2021-04-19 MED ORDER — METHYLERGONOVINE MALEATE 0.2 MG PO TABS: 0.2000 mg | ORAL_TABLET | ORAL | Status: DC | PRN

## 2021-04-19 MED ORDER — COCONUT OIL OIL: 1.0000 "application " | TOPICAL_OIL | Status: DC | PRN

## 2021-04-19 MED ORDER — WITCH HAZEL-GLYCERIN EX PADS: 1.0000 "application " | MEDICATED_PAD | CUTANEOUS | Status: DC | PRN

## 2021-04-19 MED ORDER — KETOROLAC TROMETHAMINE 30 MG/ML IJ SOLN: 30.0000 mg | Freq: Four times a day (QID) | INTRAMUSCULAR | Status: AC | PRN

## 2021-04-19 MED ORDER — KETOROLAC TROMETHAMINE 30 MG/ML IJ SOLN
INTRAMUSCULAR | Status: AC
  Filled 2021-04-19: qty 1

## 2021-04-19 MED ORDER — PHENYLEPHRINE 40 MCG/ML (10ML) SYRINGE FOR IV PUSH (FOR BLOOD PRESSURE SUPPORT)
PREFILLED_SYRINGE | INTRAVENOUS | Status: AC
  Filled 2021-04-19: qty 10

## 2021-04-19 MED ORDER — DIPHENHYDRAMINE HCL 25 MG PO CAPS: 25.0000 mg | ORAL_CAPSULE | Freq: Four times a day (QID) | ORAL | Status: DC | PRN

## 2021-04-19 MED ORDER — OXYTOCIN-SODIUM CHLORIDE 30-0.9 UT/500ML-% IV SOLN
INTRAVENOUS | Status: DC | PRN
  Administered 2021-04-19: 500 mL via INTRAVENOUS

## 2021-04-19 MED ORDER — SCOPOLAMINE 1 MG/3DAYS TD PT72
MEDICATED_PATCH | TRANSDERMAL | Status: AC
  Filled 2021-04-19: qty 1

## 2021-04-19 MED ORDER — KETOROLAC TROMETHAMINE 30 MG/ML IJ SOLN
30.0000 mg | Freq: Four times a day (QID) | INTRAMUSCULAR | Status: AC | PRN
  Administered 2021-04-19: 30 mg via INTRAVENOUS

## 2021-04-19 MED ORDER — CEFAZOLIN IN SODIUM CHLORIDE 3-0.9 GM/100ML-% IV SOLN
3.0000 g | INTRAVENOUS | Status: AC
  Administered 2021-04-19: 3 g via INTRAVENOUS

## 2021-04-19 MED ORDER — SOD CITRATE-CITRIC ACID 500-334 MG/5ML PO SOLN
30.0000 mL | Freq: Once | ORAL | Status: AC
Start: 2021-04-19 — End: 2021-04-19
  Administered 2021-04-19: 30 mL via ORAL

## 2021-04-19 MED ORDER — DEXAMETHASONE SODIUM PHOSPHATE 4 MG/ML IJ SOLN
INTRAMUSCULAR | Status: AC
  Filled 2021-04-19: qty 2

## 2021-04-19 MED ORDER — ONDANSETRON HCL 4 MG/2ML IJ SOLN
INTRAMUSCULAR | Status: DC | PRN
  Administered 2021-04-19: 4 mg via INTRAVENOUS

## 2021-04-19 MED ORDER — NALBUPHINE HCL 10 MG/ML IJ SOLN: 5.0000 mg | Freq: Once | INTRAMUSCULAR | Status: DC | PRN

## 2021-04-19 MED ORDER — PROMETHAZINE HCL 25 MG/ML IJ SOLN: 6.2500 mg | INTRAMUSCULAR | Status: DC | PRN

## 2021-04-19 MED ORDER — ACETAMINOPHEN 10 MG/ML IV SOLN
INTRAVENOUS | Status: AC
  Filled 2021-04-19: qty 100

## 2021-04-19 MED ORDER — OXYCODONE-ACETAMINOPHEN 5-325 MG PO TABS
1.0000 | ORAL_TABLET | ORAL | Status: DC | PRN
  Administered 2021-04-20 (×2): 2 via ORAL
  Filled 2021-04-19 (×2): qty 2

## 2021-04-19 MED ORDER — TRANEXAMIC ACID-NACL 1000-0.7 MG/100ML-% IV SOLN
INTRAVENOUS | Status: DC | PRN
  Administered 2021-04-19: 1000 mg via INTRAVENOUS

## 2021-04-19 MED ORDER — ONDANSETRON HCL 4 MG/2ML IJ SOLN
INTRAMUSCULAR | Status: AC
  Filled 2021-04-19: qty 2

## 2021-04-19 MED ORDER — PRENATAL MULTIVITAMIN CH
1.0000 | ORAL_TABLET | Freq: Every day | ORAL | Status: DC
  Administered 2021-04-20 – 2021-04-21 (×2): 1 via ORAL
  Filled 2021-04-19 (×2): qty 1

## 2021-04-19 MED ORDER — TRANEXAMIC ACID-NACL 1000-0.7 MG/100ML-% IV SOLN
INTRAVENOUS | Status: AC
  Filled 2021-04-19: qty 100

## 2021-04-19 MED ORDER — OXYTOCIN-SODIUM CHLORIDE 30-0.9 UT/500ML-% IV SOLN: 2.5000 [IU]/h | INTRAVENOUS | Status: AC

## 2021-04-19 MED ORDER — CEFAZOLIN SODIUM-DEXTROSE 2-4 GM/100ML-% IV SOLN
INTRAVENOUS | Status: AC
  Filled 2021-04-19: qty 100

## 2021-04-19 MED ORDER — POVIDONE-IODINE 10 % EX SWAB
2.0000 "application " | Freq: Once | CUTANEOUS | Status: AC
  Administered 2021-04-19: 2 via TOPICAL

## 2021-04-19 MED ORDER — CEFAZOLIN IN SODIUM CHLORIDE 3-0.9 GM/100ML-% IV SOLN
INTRAVENOUS | Status: AC
  Filled 2021-04-19: qty 100

## 2021-04-19 MED ORDER — DIPHENHYDRAMINE HCL 50 MG/ML IJ SOLN: 12.5000 mg | INTRAMUSCULAR | Status: DC | PRN

## 2021-04-19 MED ORDER — ONDANSETRON HCL 4 MG/2ML IJ SOLN: 4.0000 mg | Freq: Three times a day (TID) | INTRAMUSCULAR | Status: DC | PRN

## 2021-04-19 MED ORDER — OXYCODONE HCL 5 MG/5ML PO SOLN: 5.0000 mg | Freq: Once | ORAL | Status: DC | PRN

## 2021-04-19 MED ORDER — ACETAMINOPHEN 10 MG/ML IV SOLN
INTRAVENOUS | Status: DC | PRN
  Administered 2021-04-19: 1000 mg via INTRAVENOUS

## 2021-04-19 MED ORDER — MORPHINE SULFATE (PF) 0.5 MG/ML IJ SOLN
INTRAMUSCULAR | Status: DC | PRN
  Administered 2021-04-19: .15 mg via INTRATHECAL

## 2021-04-19 MED ORDER — SCOPOLAMINE 1 MG/3DAYS TD PT72
1.0000 | MEDICATED_PATCH | Freq: Once | TRANSDERMAL | Status: DC
  Administered 2021-04-19: 1.5 mg via TRANSDERMAL

## 2021-04-19 MED ORDER — TETANUS-DIPHTH-ACELL PERTUSSIS 5-2.5-18.5 LF-MCG/0.5 IM SUSY: 0.5000 mL | PREFILLED_SYRINGE | Freq: Once | INTRAMUSCULAR | Status: DC

## 2021-04-19 MED ORDER — BUPIVACAINE HCL (PF) 0.25 % IJ SOLN
INTRAMUSCULAR | Status: AC
  Filled 2021-04-19: qty 10

## 2021-04-19 MED ORDER — ORAL CARE MOUTH RINSE: 15.0000 mL | Freq: Once | OROMUCOSAL | Status: AC

## 2021-04-19 MED ORDER — SODIUM CHLORIDE 0.9 % IR SOLN
Status: DC | PRN
  Administered 2021-04-19: 1

## 2021-04-19 MED ORDER — FENTANYL CITRATE (PF) 100 MCG/2ML IJ SOLN
INTRAMUSCULAR | Status: DC | PRN
  Administered 2021-04-19: 15 ug via INTRATHECAL

## 2021-04-19 MED ORDER — DEXAMETHASONE SODIUM PHOSPHATE 4 MG/ML IJ SOLN
INTRAMUSCULAR | Status: DC | PRN
  Administered 2021-04-19: 4 mg via INTRAVENOUS

## 2021-04-19 MED ORDER — DIBUCAINE (PERIANAL) 1 % EX OINT: 1.0000 "application " | TOPICAL_OINTMENT | CUTANEOUS | Status: DC | PRN

## 2021-04-19 MED ORDER — FENTANYL CITRATE (PF) 100 MCG/2ML IJ SOLN: 25.0000 ug | INTRAMUSCULAR | Status: DC | PRN

## 2021-04-19 MED ORDER — IBUPROFEN 600 MG PO TABS
600.0000 mg | ORAL_TABLET | Freq: Four times a day (QID) | ORAL | Status: DC
  Administered 2021-04-20 – 2021-04-21 (×4): 600 mg via ORAL
  Filled 2021-04-19 (×5): qty 1

## 2021-04-19 MED ORDER — CHLORHEXIDINE GLUCONATE 0.12 % MT SOLN
OROMUCOSAL | Status: AC
  Filled 2021-04-19: qty 15

## 2021-04-19 MED ORDER — SENNOSIDES-DOCUSATE SODIUM 8.6-50 MG PO TABS
2.0000 | ORAL_TABLET | Freq: Every day | ORAL | Status: DC
  Administered 2021-04-20 – 2021-04-21 (×2): 2 via ORAL
  Filled 2021-04-19 (×2): qty 2

## 2021-04-19 MED ORDER — KETOROLAC TROMETHAMINE 30 MG/ML IJ SOLN
30.0000 mg | Freq: Four times a day (QID) | INTRAMUSCULAR | Status: AC
  Administered 2021-04-19 – 2021-04-20 (×3): 30 mg via INTRAVENOUS
  Filled 2021-04-19 (×3): qty 1

## 2021-04-19 SURGICAL SUPPLY — 42 items
APL SKNCLS STERI-STRIP NONHPOA (GAUZE/BANDAGES/DRESSINGS) ×1
BENZOIN TINCTURE PRP APPL 2/3 (GAUZE/BANDAGES/DRESSINGS) ×1 IMPLANT
CHLORAPREP W/TINT 26ML (MISCELLANEOUS) ×2 IMPLANT
CLAMP CORD UMBIL (MISCELLANEOUS) IMPLANT
CLOTH BEACON ORANGE TIMEOUT ST (SAFETY) ×2 IMPLANT
DRSG OPSITE POSTOP 4X10 (GAUZE/BANDAGES/DRESSINGS) ×2 IMPLANT
ELECT REM PT RETURN 9FT ADLT (ELECTROSURGICAL) ×2
ELECTRODE REM PT RTRN 9FT ADLT (ELECTROSURGICAL) ×1 IMPLANT
EXTRACTOR VACUUM M CUP 4 TUBE (SUCTIONS) IMPLANT
GLOVE BIO SURGEON STRL SZ7.5 (GLOVE) ×2 IMPLANT
GLOVE BIOGEL PI IND STRL 7.0 (GLOVE) ×1 IMPLANT
GLOVE BIOGEL PI INDICATOR 7.0 (GLOVE) ×1
GOWN STRL REUS W/TWL LRG LVL3 (GOWN DISPOSABLE) ×4 IMPLANT
HOVERMATT SINGLE USE (MISCELLANEOUS) ×2 IMPLANT
KIT ABG SYR 3ML LUER SLIP (SYRINGE) IMPLANT
NDL HYPO 25X5/8 SAFETYGLIDE (NEEDLE) IMPLANT
NDL SPNL 20GX3.5 QUINCKE YW (NEEDLE) IMPLANT
NEEDLE HYPO 22GX1.5 SAFETY (NEEDLE) ×2 IMPLANT
NEEDLE HYPO 25X5/8 SAFETYGLIDE (NEEDLE) IMPLANT
NEEDLE SPNL 20GX3.5 QUINCKE YW (NEEDLE) IMPLANT
NS IRRIG 1000ML POUR BTL (IV SOLUTION) ×2 IMPLANT
PACK C SECTION WH (CUSTOM PROCEDURE TRAY) ×2 IMPLANT
PENCIL BUTTON HOLSTER BLD 10FT (ELECTRODE) ×2 IMPLANT
PENCIL SMOKE EVAC W/HOLSTER (ELECTROSURGICAL) ×2 IMPLANT
RETRACTOR TRAXI PANNICULUS (MISCELLANEOUS) ×1 IMPLANT
STRIP CLOSURE SKIN 1/2X4 (GAUZE/BANDAGES/DRESSINGS) ×1 IMPLANT
SUT MNCRL 0 VIOLET CTX 36 (SUTURE) ×2 IMPLANT
SUT MNCRL AB 3-0 PS2 27 (SUTURE) IMPLANT
SUT MON AB 2-0 CT1 27 (SUTURE) ×2 IMPLANT
SUT MON AB-0 CT1 36 (SUTURE) ×4 IMPLANT
SUT MONOCRYL 0 CTX 36 (SUTURE) ×4
SUT PLAIN 0 NONE (SUTURE) IMPLANT
SUT PLAIN 2 0 (SUTURE)
SUT PLAIN 2 0 XLH (SUTURE) ×1 IMPLANT
SUT PLAIN ABS 2-0 CT1 27XMFL (SUTURE) IMPLANT
SUT VIC AB 4-0 KS 27 (SUTURE) ×2 IMPLANT
SYR 20CC LL (SYRINGE) IMPLANT
SYR CONTROL 10ML LL (SYRINGE) ×2 IMPLANT
TOWEL OR 17X24 6PK STRL BLUE (TOWEL DISPOSABLE) ×2 IMPLANT
TRAXI PANNICULUS RETRACTOR (MISCELLANEOUS) ×1
TRAY FOLEY W/BAG SLVR 14FR LF (SET/KITS/TRAYS/PACK) ×2 IMPLANT
WATER STERILE IRR 1000ML POUR (IV SOLUTION) ×2 IMPLANT

## 2021-04-19 NOTE — Anesthesia Postprocedure Evaluation (Signed)
Anesthesia Post Note  Patient: Alicia Christensen  Procedure(s) Performed: Repeat CESAREAN SECTION     Patient location during evaluation: PACU Anesthesia Type: Spinal Level of consciousness: awake and alert Pain management: pain level controlled Vital Signs Assessment: post-procedure vital signs reviewed and stable Respiratory status: spontaneous breathing and respiratory function stable Cardiovascular status: blood pressure returned to baseline and stable Postop Assessment: spinal receding and no apparent nausea or vomiting Anesthetic complications: no   No notable events documented.  Last Vitals:  Vitals:   04/19/21 1243 04/19/21 1328  BP: 114/76 121/71  Pulse: 94 96  Resp: 16 18  Temp: 37.1 C 36.8 C  SpO2: 99% 100%    Last Pain:  Vitals:   04/19/21 1328  TempSrc: Oral   Pain Goal:                   Beryle Lathe

## 2021-04-19 NOTE — Op Note (Signed)
Cesarean Section Procedure Note  Indications: malpresentation: left oblique, previous uterine incision kerr x one, and poorly controlled A2DM  Pre-operative Diagnosis: 38 week 5 day pregnancy.  Post-operative Diagnosis: same  Surgeon: Lenoard Aden   Assistants: Yetta Barre, CNM  Anesthesia: Local anesthesia 0.25.% bupivacaine and Spinal anesthesia  ASA Class: 2  Procedure Details  The patient was seen in the Holding Room. The risks, benefits, complications, treatment options, and expected outcomes were discussed with the patient.  The patient concurred with the proposed plan, giving informed consent. The risks of anesthesia, infection, bleeding and possible injury to other organs discussed. Injury to bowel, bladder, or ureter with possible need for repair discussed. Possible need for transfusion with secondary risks of hepatitis or HIV acquisition discussed. Post operative complications to include but not limited to DVT, PE and Pneumonia noted. The site of surgery properly noted/marked. The patient was taken to Operating Room # C, identified as Alicia Christensen and the procedure verified as C-Section Delivery. A Time Out was held and the above information confirmed.  After induction of anesthesia, the patient was draped and prepped in the usual sterile manner. A Pfannenstiel incision was made and carried down through the subcutaneous tissue to the fascia. Fascial incision was made and extended transversely using Mayo scissors. The fascia was separated from the underlying rectus tissue superiorly and inferiorly. The peritoneum was identified and entered. Peritoneal incision was extended longitudinally. The utero-vesical peritoneal reflection was incised transversely and the bladder flap was bluntly freed from the lower uterine segment. A low transverse uterine incision(Kerr hysterotomy) was made. Delivered from left oblique presentation was a  female with Apgar scores of 8 at one minute and 9 at five  minutes. Bulb suctioning gently performed. Neonatal team in attendance.After the umbilical cord was clamped and cut cord blood was obtained for evaluation. The placenta was removed intact and appeared normal. The uterus was curetted with a dry lap pack. Good hemostasis was noted.The uterine outline, tubes and ovaries appeared normal. The uterine incision was closed with running locked sutures of 0 Monocryl x 2 layers. Hemostasis was observed. The parietal peritoneum was closed with a running 2-0 Monocryl suture. The fascia was then reapproximated with running sutures of 0 Monocryl. The skin was reapproximated with 4=0 vicryl after [place] closure with 2-0 plain  Instrument, sponge, and needle counts were correct prior the abdominal closure and at the conclusion of the case.   Findings: FTLM, post placenta, nl adnexa  Estimated Blood Loss:  300 mL         Drains: foley                 Specimens: placenta                 Complications:  None; patient tolerated the procedure well.         Disposition: PACU - hemodynamically stable.         Condition: stable  Attending Attestation: I performed the procedure.

## 2021-04-19 NOTE — H&P (Signed)
Alicia Christensen is a 35 y.o. female presenting for rpt csection due to poorly compliant A2DM  on qid metformin with polyhydramnios. OB History     Gravida  3   Para  1   Term  1   Preterm      AB  1   Living  1      SAB      IAB  1   Ectopic      Multiple  0   Live Births  1          Past Medical History:  Diagnosis Date   Environmental allergies    Frequent headaches    Gestational diabetes    Hx of varicella    Migraine    Seasonal allergies    Vaginal Pap smear, abnormal    Past Surgical History:  Procedure Laterality Date   CESAREAN SECTION N/A 01/15/2016   Procedure: CESAREAN SECTION;  Surgeon: Alicia Mackie, MD;  Location: [facility];  Service: Obstetrics;  Laterality: N/A;   DILATION AND CURETTAGE OF UTERUS     WISDOM TOOTH EXTRACTION     Family History: family history includes Alcoholism in her father; Diabetes in her mother and paternal grandfather; Heart attack in her maternal grandfather and paternal grandfather; Heart disease in her paternal aunt; Hyperlipidemia in her mother; Hypertension in her maternal grandmother; Varicose Veins in her mother. Social History:  reports that she has never smoked. She has never used smokeless tobacco. She reports current alcohol use. She reports that she does not use drugs.     Maternal Diabetes: Yes:  Diabetes Type:  Insulin/Medication controlled Genetic Screening: Normal Maternal Ultrasounds/Referrals: Normal Fetal Ultrasounds or other Referrals:  None Maternal Substance Abuse:  Yes:  Type: Other: none Significant Maternal Medications:  Meds include: Other: metformin Significant Maternal Lab Results:  Group B Strep negative Other Comments:  None  Review of Systems  Constitutional: Negative.   Maternal Medical History:  Reason for admission: Contractions.   Contractions: Frequency: rare.   Perceived severity is mild.   Fetal activity: Perceived fetal activity is normal.   Last perceived fetal movement  was within the past hour.   Prenatal complications: Polyhydramnios.   Prenatal Complications - Diabetes: gestational. Diabetes is managed by diet and oral agent (monotherapy).      Blood pressure (!) 142/85, pulse 98, temperature 99.2 F (37.3 C), temperature source Oral, resp. rate 18, height 5\' 5"  (1.651 m), weight 127 kg, SpO2 98 %. Maternal Exam:  Uterine Assessment: Contraction strength is mild.  Contraction frequency is rare.  Abdomen: Patient reports no abdominal tenderness. Surgical scars: low transverse.   Fetal presentation: shoulder Introitus: Normal vulva. Normal vagina.  Ferning test: not done.  Nitrazine test: not done. Amniotic fluid character: not assessed. Pelvis: questionable for delivery.   Cervix: Cervix evaluated by digital exam.    Physical Exam Constitutional:      Appearance: Normal appearance. She is obese.  HENT:     Head: Normocephalic and atraumatic.  Cardiovascular:     Rate and Rhythm: Normal rate and regular rhythm.     Pulses: Normal pulses.     Heart sounds: Normal heart sounds.  Pulmonary:     Effort: Pulmonary effort is normal.     Breath sounds: Normal breath sounds.  Abdominal:     Palpations: Abdomen is soft.  Genitourinary:    General: Normal vulva.  Musculoskeletal:     Cervical back: Normal range of motion and neck supple.  Skin:  General: Skin is warm and dry.  Neurological:     General: No focal deficit present.     Mental Status: She is alert and oriented to person, place, and time.  Psychiatric:        Mood and Affect: Mood normal.        Behavior: Behavior normal.    Prenatal labs: ABO, Rh: --/--/O POS (07/05 6948) Antibody: NEG (07/05 0933) Rubella:  imm RPR:   neg HBsAg:   neg HIV:   neg GBS:  neg   Assessment/Plan: [redacted]w[redacted]d IUP Poorly controlled A2DM with poly Previous csection for rpt   Surgical risks vs benefits discussed. Consent signed.   Alicia Christensen J 04/19/2021, 9:52 AM

## 2021-04-19 NOTE — Anesthesia Procedure Notes (Signed)
Spinal  Patient location during procedure: OR Start time: 04/19/2021 10:23 AM End time: 04/19/2021 10:27 AM Reason for block: surgical anesthesia Staffing Performed: anesthesiologist  Anesthesiologist: Beryle Lathe, MD Preanesthetic Checklist Completed: patient identified, IV checked, risks and benefits discussed, surgical consent, monitors and equipment checked, pre-op evaluation and timeout performed Spinal Block Patient position: sitting Prep: DuraPrep Patient monitoring: heart rate, cardiac monitor, continuous pulse ox and blood pressure Approach: midline Location: L3-4 Injection technique: single-shot Needle Needle type: Pencan  Needle gauge: 24 G Additional Notes Consent was obtained prior to the procedure with all questions answered and concerns addressed. Risks including, but not limited to, bleeding, infection, nerve damage, paralysis, failed block, inadequate analgesia, allergic reaction, high spinal, itching, and headache were discussed and the patient wished to proceed. Functioning IV was confirmed and monitors were applied. Sterile prep and drape, including hand hygiene, mask, and sterile gloves were used. The patient was positioned and the spine was prepped. The skin was anesthetized with lidocaine. Free flow of clear CSF was obtained prior to injecting local anesthetic into the CSF. The spinal needle aspirated freely following injection. The needle was carefully withdrawn. The patient tolerated the procedure well.   Leslye Peer, MD

## 2021-04-19 NOTE — Transfer of Care (Signed)
Immediate Anesthesia Transfer of Care Note  Patient: Alicia Christensen  Procedure(s) Performed: Repeat CESAREAN SECTION  Patient Location: PACU  Anesthesia Type:Spinal  Level of Consciousness: awake, alert  and oriented  Airway & Oxygen Therapy: Patient Spontanous Breathing  Post-op Assessment: Report given to RN and Post -op Vital signs reviewed and stable  Post vital signs: Reviewed and stable  Last Vitals:  Vitals Value Taken Time  BP 129/77 04/19/21 1130  Temp    Pulse 76 04/19/21 1131  Resp 18 04/19/21 1131  SpO2 99 % 04/19/21 1131  Vitals shown include unvalidated device data.  Last Pain:  Vitals:   04/19/21 0816  TempSrc: Oral         Complications: No notable events documented.

## 2021-04-19 NOTE — Progress Notes (Signed)
RN viewed patient orders and noticed patient had continuous entidal CO2 orders and continuous pulse ox orders. Patient did not have oxygen on nor a pulse oximeter. Patient had not had either on all day. RN called midwife on call Dorisann Frames and asked about the orders. Midwife stated that the patient did not need that and gave verbal orders to discontinue to pulse ox and entidal orders.

## 2021-04-19 NOTE — Anesthesia Preprocedure Evaluation (Addendum)
Anesthesia Evaluation  Patient identified by MRN, date of birth, ID band Patient awake    Reviewed: Allergy & Precautions, NPO status , Patient's Chart, lab work & pertinent test results  History of Anesthesia Complications Negative for: history of anesthetic complications  Airway Mallampati: III   Neck ROM: Full    Dental   Pulmonary neg pulmonary ROS,    Pulmonary exam normal        Cardiovascular negative cardio ROS Normal cardiovascular exam     Neuro/Psych  Headaches, negative psych ROS   GI/Hepatic negative GI ROS, Neg liver ROS,   Endo/Other  diabetes, Gestational, Oral Hypoglycemic AgentsMorbid obesity  Renal/GU negative Renal ROS     Musculoskeletal negative musculoskeletal ROS (+)   Abdominal   Peds  Hematology  (+) anemia ,  Plt 215k    Anesthesia Other Findings Covid test negative   Reproductive/Obstetrics (+) Pregnancy                            Anesthesia Physical Anesthesia Plan  ASA: 3  Anesthesia Plan: Spinal   Post-op Pain Management:    Induction:   PONV Risk Score and Plan: 2 and Treatment may vary due to age or medical condition, Ondansetron and Scopolamine patch - Pre-op  Airway Management Planned: Natural Airway  Additional Equipment: None  Intra-op Plan:   Post-operative Plan:   Informed Consent: I have reviewed the patients History and Physical, chart, labs and discussed the procedure including the risks, benefits and alternatives for the proposed anesthesia with the patient or authorized representative who has indicated his/her understanding and acceptance.       Plan Discussed with: CRNA and Anesthesiologist  Anesthesia Plan Comments: (Labs reviewed, platelets acceptable. Discussed risks and benefits of spinal, including spinal/epidural hematoma, infection, failed block, and PDPH. Patient expressed understanding and wished to proceed. )        Anesthesia Quick Evaluation

## 2021-04-20 DIAGNOSIS — O9902 Anemia complicating childbirth: Secondary | ICD-10-CM | POA: Diagnosis not present

## 2021-04-20 LAB — CBC
HCT: 29.3 % — ABNORMAL LOW (ref 36.0–46.0)
Hemoglobin: 9.2 g/dL — ABNORMAL LOW (ref 12.0–15.0)
MCH: 25.3 pg — ABNORMAL LOW (ref 26.0–34.0)
MCHC: 31.4 g/dL (ref 30.0–36.0)
MCV: 80.7 fL (ref 80.0–100.0)
Platelets: 195 10*3/uL (ref 150–400)
RBC: 3.63 MIL/uL — ABNORMAL LOW (ref 3.87–5.11)
RDW: 16.9 % — ABNORMAL HIGH (ref 11.5–15.5)
WBC: 9.7 10*3/uL (ref 4.0–10.5)
nRBC: 0 % (ref 0.0–0.2)

## 2021-04-20 LAB — GLUCOSE, CAPILLARY
Glucose-Capillary: 88 mg/dL (ref 70–99)
Glucose-Capillary: 104 mg/dL — ABNORMAL HIGH (ref 70–99)

## 2021-04-20 LAB — BIRTH TISSUE RECOVERY COLLECTION (PLACENTA DONATION)

## 2021-04-20 MED ORDER — POLYSACCHARIDE IRON COMPLEX 150 MG PO CAPS
150.0000 mg | ORAL_CAPSULE | Freq: Every day | ORAL | Status: DC
  Administered 2021-04-20 – 2021-04-21 (×2): 150 mg via ORAL
  Filled 2021-04-20 (×2): qty 1

## 2021-04-20 MED ORDER — MAGNESIUM OXIDE -MG SUPPLEMENT 400 (240 MG) MG PO TABS
400.0000 mg | ORAL_TABLET | Freq: Every day | ORAL | Status: DC
  Administered 2021-04-20 – 2021-04-21 (×2): 400 mg via ORAL
  Filled 2021-04-20 (×2): qty 1

## 2021-04-20 MED ORDER — INSULIN ASPART 100 UNIT/ML IJ SOLN: 0.0000 [IU] | Freq: Three times a day (TID) | INTRAMUSCULAR | Status: DC

## 2021-04-20 NOTE — Progress Notes (Signed)
Subjective: POD# 1 Live born female  Birth Weight: 9 lb 2.9 oz (4165 g) APGAR: 7, 9  Newborn Delivery   Birth date/time: 04/19/2021 10:46:00 Delivery type: C-Section, Low Transverse Trial of labor: No C-section categorization: Repeat     Baby name: Alicia Christensen Delivering provider: Olivia Mackie   Circumcision Yes, planning  Feeding: bottle  Pain control at delivery: Spinal   Reports feeling moderate pain with ambulation. Able to use the restroom independently and has been ambulating in the room. Formula feeding only. Husband at the bedside providing support.   Patient reports tolerating PO.   Pain controlled with ibuprofen (OTC) and prescription NSAID's including ketorolac (Toradol) Denies HA/SOB/C/P/N/V/dizziness. She reports vaginal bleeding as normal, without clots. She is ambulating and urinating without difficulty.     Objective:  Vitals:   04/19/21 2037 04/19/21 2220 04/20/21 0243 04/20/21 0704  BP:  135/82 128/65 131/84  Pulse: 88 87 79 81  Resp:   16   Temp:  98.5 F (36.9 C) 98.2 F (36.8 C) 97.8 F (36.6 C)  TempSrc:  Oral Oral Oral  SpO2: 98% 99% 99% 100%  Weight:      Height:         Intake/Output Summary (Last 24 hours) at 04/20/2021 5809 Last data filed at 04/20/2021 9833 Gross per 24 hour  Intake 1200 ml  Output 2542 ml  Net -1342 ml      Recent Labs    04/18/21 0931 04/20/21 0558  WBC 9.1 9.7  HGB 10.6* 9.2*  HCT 34.0* 29.3*  PLT 215 195    Blood type: --/--/O POS (07/05 0933)  Rubella:   Immune  Vaccines:   TDaP          UTD                      COVID-19 UTD  Physical Exam:  General: alert and cooperative CV: Regular rate and rhythm Resp: clear Abdomen: soft, nontender, normal bowel sounds Incision: clean, dry, and intact Uterine Fundus: firm, below umbilicus, nontender Lochia: minimal Ext: extremities normal, atraumatic, no cyanosis or edema  Assessment/Plan: 35 y.o.   POD# 1. [id number]                  Principal Problem:    Postpartum care following cesarean delivery 7/6  Encourage rest when baby rests Encourage to ambulate Routine post-op care Active Problems:   Severe obesity (BMI >= 40) (HCC)   Repeat low transverse cesarean section 7/6   Gestational diabetes mellitus treated with oral hypoglycemic therapy  Fasting 101 this AM  Poorly controlled during pregnancy, was on Metformin 500mg   Diabetic consult pending  Will do fasting and postprandial CBGs   Previous cesarean delivery affecting pregnancy   Maternal anemia, with delivery  Asymptomatic  Start PO Niferex and mag oxide  , CNM, MSN 04/20/2021, 9:18 AM

## 2021-04-20 NOTE — Progress Notes (Addendum)
Inpatient Diabetes Program Recommendations  AACE/ADA: New Consensus Statement on Inpatient Glycemic Control (2015)  Target Ranges:  Prepandial:   less than 140 mg/dL      Peak postprandial:   less than 180 mg/dL (1-2 hours)      Critically ill patients:  140 - 180 mg/dL   Lab Results  Component Value Date   GLUCAP 101 (H) 04/19/2021    Review of Glycemic Control Results for MACAIAH, MANGAL (MRN [id number]) as of 04/20/2021 09:26  Ref. Range 04/19/2021 08:17 04/19/2021 11:40  Glucose-Capillary Latest Ref Range: 70 - 99 mg/dL 497 (H) 026 (H)   Diabetes history: GDM Outpatient Diabetes medications: Metformin 500 mg BID Current orders for Inpatient glycemic control: none Decadron 4 mg x 1  Inpatient Diabetes Program Recommendations:    Noted consult. Consider adding Novolog 0-9 units TID &HS and changing diet to carb modified if appropriate.  Anticipate CBGs to be elevated today given administration of steroids during CS.   Addendum: Spoke with patient regarding outpatient diabetes management. Patient verified taking Metformin, reports occasionally missing doses. Noted that fastings would be elevated if followed by a bedtime snack. Explained what a A1c is and what it measures. Also reviewed goal A1c with patient, importance of good glucose control @ home, and blood sugar goals. Reviewed patho of DM, GDM, Metformin, target goals, impact of hormonal fluctuations postpartum on glycemic control, impact of steroids, risk of developing Type 2 DM, importance of following up with OB/PCP, vascular changes and commorbidities. Patient has a meter and reports range of 90's-140's (FSBG and 1 hr PP).  Encouraged to continue to be mindful of CHO intake as patient admits to drinking large amounts of sweet tea. Discussed alternatives, plate method and importance of incorporating protein into meals and snacks. Patient expresses understanding and has no further questions at this time.   Thanks, Lujean Rave, MSN, RNC-OB Diabetes Coordinator [phone] (8a-5p)

## 2021-04-21 LAB — GLUCOSE, CAPILLARY: Glucose-Capillary: 91 mg/dL (ref 70–99)

## 2021-04-21 MED ORDER — OXYCODONE-ACETAMINOPHEN 5-325 MG PO TABS: 1.0000 | ORAL_TABLET | ORAL | 0 refills | Status: DC | PRN

## 2021-04-21 MED ORDER — IBUPROFEN 600 MG PO TABS: 600.0000 mg | ORAL_TABLET | Freq: Four times a day (QID) | ORAL | 0 refills | Status: DC

## 2021-04-21 MED ORDER — POLYSACCHARIDE IRON COMPLEX 150 MG PO CAPS: 150.0000 mg | ORAL_CAPSULE | Freq: Every day | ORAL | Status: DC

## 2021-04-21 MED ORDER — SENNOSIDES-DOCUSATE SODIUM 8.6-50 MG PO TABS: 2.0000 | ORAL_TABLET | Freq: Every evening | ORAL | Status: DC | PRN

## 2021-04-21 NOTE — Discharge Summary (Signed)
Postpartum Discharge Summary  Date of Service updated 04/21/21     Patient Name: Alicia Christensen DOB: [birth year] MRN: [id number]  Date of admission: 04/19/2021 Delivery date:04/19/2021  Delivering provider: [name]  Date of discharge: 04/21/2021  Admitting diagnosis: Gestational diabetes mellitus treated with oral hypoglycemic therapy [O24.415] Previous cesarean delivery affecting pregnancy [O34.219] Intrauterine pregnancy: [redacted]w[redacted]d    Secondary diagnosis:  Principal Problem:   Postpartum care following cesarean delivery 7/6 Active Problems:   Severe obesity (BMI >= 40) (HCC)   Repeat low transverse cesarean section 7/6   Gestational diabetes mellitus treated with oral hypoglycemic therapy   Previous cesarean delivery affecting pregnancy   Maternal anemia, with delivery  Additional problems: n/a    Discharge diagnosis: Term Pregnancy Delivered, GDM A2, and Anemia                                              Post partum procedures: none Augmentation: N/A Complications: None  Hospital course: Sceduled C/S   35y.o. yo [id number] admitted to the hospital 04/19/2021 for scheduled cesarean section with the following indication:Malpresentation and poorly controlled A2GDM .Delivery details are as follows:  Membrane Rupture Time/Date: 10:46 AM ,04/19/2021   Delivery Method:C-Section, Low Transverse  Details of operation can be found in separate operative note.  Patient had an uncomplicated postpartum course.  She is ambulating, tolerating a regular diet, passing flatus, and urinating well. Patient is discharged home in stable condition on  04/21/21        Newborn Data: Birth date:04/19/2021  Birth time:10:46 AM  Gender:Female  Living status:Living  Apgars:7 ,9  Weight:4165 g    "Everette" Magnesium Sulfate received: No BMZ received: No Rhophylac:N/A MMR:N/A T-DaP:Given prenatally Flu: N/A Covid: UTD Transfusion:No  Physical exam  Vitals:   04/20/21 0704 04/20/21  1406 04/21/21 0201 04/21/21 0500  BP: 131/84 118/60 124/76 130/70  Pulse: 81 78 79 78  Resp:  16    Temp: 97.8 F (36.6 C) 98.1 F (36.7 C) 97.6 F (36.4 C) 97.9 F (36.6 C)  TempSrc: Oral Oral Oral Oral  SpO2: 100% 99% 98% 98%  Weight:      Height:      Alert and Oriented X3  Lungs: Clear and unlabored  Heart: regular rate and rhythm / no murmurs  Abdomen: soft, non-tender, obese mild gaseous distention, active bowel sounds, dependent pitting edema noted at base of pannus. Contact irritation noted from drape on the right and left side superior to the honeycomb dressing              Fundus: firm, non-tender, below umbilicus             Dressing: honeycomb with steri-strips c/d/i              Incision:  approximated with sutures / no erythema / no ecchymosis / no drainage  Perineum: intact   Lochia: appropriate   Extremities: trace LE edema, no calf pain or tenderness,   Labs: Lab Results  Component Value Date   WBC 9.7 04/20/2021   HGB 9.2 (L) 04/20/2021   HCT 29.3 (L) 04/20/2021   MCV 80.7 04/20/2021   PLT 195 04/20/2021   CMP Latest Ref Rng & Units 01/15/2016  Glucose 65 - 99 mg/dL 110(H)  BUN 6 - 20 mg/dL 8  Creatinine 0.44 - 1.00  mg/dL 0.52  Sodium 135 - 145 mmol/L 135  Potassium 3.5 - 5.1 mmol/L 3.8  Chloride 101 - 111 mmol/L 107  CO2 22 - 32 mmol/L 20(L)  Calcium 8.9 - 10.3 mg/dL 8.0(L)  Total Protein 6.5 - 8.1 g/dL 5.8(L)  Total Bilirubin 0.3 - 1.2 mg/dL 0.4  Alkaline Phos 38 - 126 U/L 106  AST 15 - 41 U/L 18  ALT 14 - 54 U/L 10(L)   Edinburgh Score: Edinburgh Postnatal Depression Scale Screening Tool 04/20/2021  I have been able to laugh and see the funny side of things. 0  I have looked forward with enjoyment to things. 0  I have blamed myself unnecessarily when things went wrong. 0  I have been anxious or worried for no good reason. 0  I have felt scared or panicky for no good reason. 0  Things have been getting on top of me. 1  I have been so unhappy  that I have had difficulty sleeping. 0  I have felt sad or miserable. 0  I have been so unhappy that I have been crying. 0  The thought of harming myself has occurred to me. 0  Edinburgh Postnatal Depression Scale Total 1      After visit meds:  Allergies as of 04/21/2021       Reactions   Amoxicillin Nausea And Vomiting   Hydrocodeine [dihydrocodeine] Nausea And Vomiting   Penicillins Nausea And Vomiting   Has patient had a PCN reaction causing immediate rash, facial/tongue/throat swelling, SOB or lightheadedness with hypotension: No Has patient had a PCN reaction causing severe rash involving mucus membranes or skin necrosis: No Has patient had a PCN reaction that required hospitalization No Has patient had a PCN reaction occurring within the last 10 years: No If all of the above answers are "NO", then may proceed with Cephalosporin use.        Medication List     STOP taking these medications    cetirizine 10 MG tablet Commonly known as: ZYRTEC   Flovent HFA 110 MCG/ACT inhaler Generic drug: fluticasone   metFORMIN 500 MG tablet Commonly known as: GLUCOPHAGE   promethazine-dextromethorphan 6.25-15 MG/5ML syrup Commonly known as: PROMETHAZINE-DM       TAKE these medications    albuterol 108 (90 Base) MCG/ACT inhaler Commonly known as: VENTOLIN HFA Inhale 2 puffs into the lungs every 6 (six) hours as needed for wheezing or shortness of breath.   diphenhydrAMINE 25 MG tablet Commonly known as: BENADRYL Take 25 mg by mouth every 6 (six) hours as needed for itching.   fluticasone 50 MCG/ACT nasal spray Commonly known as: FLONASE Place 1 spray into both nostrils daily as needed for allergies.   ibuprofen 600 MG tablet Commonly known as: ADVIL Take 1 tablet (600 mg total) by mouth every 6 (six) hours.   iron polysaccharides 150 MG capsule Commonly known as: NIFEREX Take 1 capsule (150 mg total) by mouth daily. Start taking on: April 22, 2021   loratadine 10  MG tablet Commonly known as: CLARITIN Take 10 mg by mouth daily.   multivitamin-prenatal 27-0.8 MG Tabs tablet Take 1 tablet by mouth daily at 12 noon.   oxyCODONE-acetaminophen 5-325 MG tablet Commonly known as: PERCOCET/ROXICET Take 1 tablet by mouth every 4 (four) hours as needed for moderate pain.   senna-docusate 8.6-50 MG tablet Commonly known as: Senokot-S Take 2 tablets by mouth at bedtime as needed for mild constipation.         Discharge home  in stable condition Infant Feeding: Bottle Infant Disposition:home with mother Discharge instruction: per After Visit Summary and Postpartum booklet. Activity: Advance as tolerated. Pelvic rest for 6 weeks.  Diet: carb modified diet and low salt diet Anticipated Birth Control:  not discussed Postpartum Appointment:6 weeks Additional Postpartum F/U: Postpartum Depression checkup Future Appointments:No future appointments. Follow up Visit:  Follow-up Information     [name], MD. Schedule an appointment as soon as possible for a visit in 6 week(s).   Specialty: Obstetrics and Gynecology Why: Postpartum visit Contact information: [place] [address] [phone]                     04/21/2021 Darliss Cheney, CNM

## 2021-04-21 NOTE — Progress Notes (Signed)
POSTOPERATIVE DAY # 2 S/P Repeat LTCS for  malpresentation: left oblique, previous uterine incision kerr x one, and poorly controlled A2DM, baby boy "Mitzie Na"   S:         Reports feeling okay, some soreness, but controlled with Motrin and Percocet. Desires early d/c home today              Tolerating po intake / no nausea / no vomiting / + flatus / no BM  Denies dizziness, SOB, or CP             Bleeding is light             Up ad lib / ambulatory/ voiding QS  Newborn formula feeding  / Circumcision - completed  Reports concern of prior yeast infection around incision after last c/s and wants to know what preventative measures to take    O:  VS: BP 130/70 (BP Location: Right Arm)   Pulse 78   Temp 97.9 F (36.6 C) (Oral)   Resp 16   Ht 5\' 5"  (1.651 m)   Wt 127 kg   SpO2 98%   Breastfeeding Unknown   BMI 46.59 kg/m  Patient Vitals for the past 24 hrs:  BP Temp Temp src Pulse Resp SpO2  04/21/21 0500 130/70 97.9 F (36.6 C) Oral 78 -- 98 %  04/21/21 0201 124/76 97.6 F (36.4 C) Oral 79 -- 98 %  04/20/21 1406 118/60 98.1 F (36.7 C) Oral 78 16 99 %      LABS:               Recent Labs    04/20/21 0558  WBC 9.7  HGB 9.2*  PLT 195               Bloodtype: --/--/O POS (07/05 0933)  Rubella:                                               I&O: Intake/Output      07/07 0701 07/08 0700 07/08 0701 07/09 0700   I.V. (mL/kg)     IV Piggyback     Total Intake(mL/kg)     Urine (mL/kg/hr) 1300 (0.4)    Blood     Total Output 1300    Net -1300                      Physical Exam:             Alert and Oriented X3  Lungs: Clear and unlabored  Heart: regular rate and rhythm / no murmurs  Abdomen: soft, non-tender, obese mild gaseous distention, active bowel sounds, dependent pitting edema noted at base of pannus. Contact irritation noted from drape on the right and left side superior to the honeycomb dressing              Fundus: firm, non-tender, below umbilicus              Dressing: honeycomb with steri-strips c/d/i              Incision:  approximated with sutures / no erythema / no ecchymosis / no drainage  Perineum: intact   Lochia: appropriate   Extremities: trace LE edema, no calf pain or tenderness,   A/P:        POD # 2 S/P Repeat  LTCS for malpresentation and poorly controlled A2 GDM            A2GDM   - s/p inpatient diabetes consult   - BS stable since yesterday and no insulin needed; off Metformin sine delivery    - Plan to f/u PP   Obesity   - Ambulation encouraged   Maternal anemia, with delivery   - stable on oral FE, continue x 6 weeks  Routine postoperative care              Breast care for formula feeding moms reviewed  Advised to keep incision clean and dry; f/u for s/s of yeast  Discharge home today  WOB discharge book given, instructions and warning s/s reviewed    F/u with Dr. Billy Coast in 6 weeks  POC in consult with Dr. Ileene Patrick, MSN, CNM [facility]

## 2021-04-21 NOTE — Progress Notes (Signed)
Patient had orders for fasting glucose at 0800 on 04/21/2021. Patient stated that she was "starving" and wanted to eat. RN called Dorisann Frames, CNM and asked for possible time modification to 0630. Dorisann Frames gave verbal order to change  fasting glucose time to 0630.

## 2021-04-24 ENCOUNTER — Other Ambulatory Visit: Payer: Self-pay

## 2021-04-24 ENCOUNTER — Encounter (HOSPITAL_COMMUNITY): Payer: Self-pay | Admitting: Obstetrics and Gynecology

## 2021-04-24 ENCOUNTER — Inpatient Hospital Stay (HOSPITAL_COMMUNITY)
Admission: AD | Admit: 2021-04-24 | Discharge: 2021-04-25 | Disposition: A | Discharge status: Home / Self Care | Payer: BC Managed Care – PPO | Attending: Obstetrics and Gynecology | Admitting: Obstetrics and Gynecology

## 2021-04-24 DIAGNOSIS — O135 Gestational [pregnancy-induced] hypertension without significant proteinuria, complicating the puerperium: Secondary | ICD-10-CM | POA: Insufficient documentation

## 2021-04-24 DIAGNOSIS — O139 Gestational [pregnancy-induced] hypertension without significant proteinuria, unspecified trimester: Secondary | ICD-10-CM

## 2021-04-24 DIAGNOSIS — Z79899 Other long term (current) drug therapy: Secondary | ICD-10-CM | POA: Diagnosis not present

## 2021-04-24 NOTE — MAU Note (Signed)
Alicia Christensen is a 35 y.o. at [redacted]w[redacted]d here in MAU reporting: pt c/o headaches on and off again since 04/22/21 with no relief with ibuprofen. Rates them 6/10. Pt checked her BP at home and was elevated 173/93.   Onset of complaint: 04/22/21 Pain score: 6/10 Vitals:   04/24/21 2340 04/24/21 2341  BP:  (!) 146/83  Pulse:  91  Resp:  16  Temp:  97.8 F (36.6 C)  SpO2: 98% 97%      Lab orders placed from triage:  UA

## 2021-04-24 NOTE — MAU Provider Note (Signed)
History     CSN: [id number]  Arrival date and time: 04/24/21 2245  CC: Postpartum headache and elevated blood pressures at home.   Chief Complaint  Patient presents with   Hypertension   Headache   HPI Alicia Christensen is POD#5 s/p repeat Cesarean presenting for concern of intermittent headaches since 04/22/21 in addition to elevated blood pressures at home to 160s-170s systolic. Pt rates headache as 6/10 and bilateral in nature. Pt reports taking ibuprofen at home with some improvement, but symptoms have returned. Denies vision changes, congestions, sore throat and fever. Pt also endorses mild chest tightening but no shortness of breath or RUQ pain. No dizziness, lightheadedness, dysuria, pain along incision site, heavy vaginal bleeding, or leg pain or swelling. Pt is formula feeding. Pt reports high stress. No safety concerns at home.  Recent pregnancy complicated by A2GDM. No prior history of gHTN or preeclampsia.   Past Medical History:  Diagnosis Date   Environmental allergies    Frequent headaches    Gestational diabetes    Hx of varicella    Migraine    Seasonal allergies    Vaginal Pap smear, abnormal     Past Surgical History:  Procedure Laterality Date   CESAREAN SECTION N/A 01/15/2016   Procedure: CESAREAN SECTION;  Surgeon: Olivia Mackie, MD;  Location: [facility];  Service: Obstetrics;  Laterality: N/A;   CESAREAN SECTION N/A 04/19/2021   Procedure: Repeat CESAREAN SECTION;  Surgeon: Olivia Mackie, MD;  Location: [facility];  Service: Obstetrics;  Laterality: N/A;  EDD: 04/29/21 Ok per Misty Stanley.   DILATION AND CURETTAGE OF UTERUS     WISDOM TOOTH EXTRACTION      Family History  Problem Relation Age of Onset   Alcoholism Father        Resolved-Living   Diabetes Mother        Liivng   Hyperlipidemia Mother    Varicose Veins Mother    Diabetes Paternal Grandfather    Heart attack Paternal Grandfather    Heart disease Paternal Aunt    Hypertension Maternal Grandmother     Heart attack Maternal Grandfather     Social History   Tobacco Use   Smoking status: Never   Smokeless tobacco: Never  Vaping Use   Vaping Use: Never used  Substance Use Topics   Alcohol use: Yes    Alcohol/week: 0.0 standard drinks    Comment: rare -- 3 x month   Drug use: No    Allergies:  Allergies  Allergen Reactions   Amoxicillin Nausea And Vomiting   Hydrocodeine [Dihydrocodeine] Nausea And Vomiting   Penicillins Nausea And Vomiting    Has patient had a PCN reaction causing immediate rash, facial/tongue/throat swelling, SOB or lightheadedness with hypotension: No Has patient had a PCN reaction causing severe rash involving mucus membranes or skin necrosis: No Has patient had a PCN reaction that required hospitalization No Has patient had a PCN reaction occurring within the last 10 years: No If all of the above answers are "NO", then may proceed with Cephalosporin use.     Medications Prior to Admission  Medication Sig Dispense Refill Last Dose   diphenhydrAMINE (BENADRYL) 25 MG tablet Take 25 mg by mouth every 6 (six) hours as needed for itching.   04/23/2021   ibuprofen (ADVIL) 600 MG tablet Take 1 tablet (600 mg total) by mouth every 6 (six) hours. 30 tablet 0 04/24/2021   oxyCODONE-acetaminophen (PERCOCET/ROXICET) 5-325 MG tablet Take 1 tablet by mouth every 4 (four) hours  as needed for moderate pain. 30 tablet 0 04/24/2021   senna-docusate (SENOKOT-S) 8.6-50 MG tablet Take 2 tablets by mouth at bedtime as needed for mild constipation.   Past Week   albuterol (VENTOLIN HFA) 108 (90 Base) MCG/ACT inhaler Inhale 2 puffs into the lungs every 6 (six) hours as needed for wheezing or shortness of breath. 6.7 g 0 Unknown   fluticasone (FLONASE) 50 MCG/ACT nasal spray Place 1 spray into both nostrils daily as needed for allergies.   Unknown   iron polysaccharides (NIFEREX) 150 MG capsule Take 1 capsule (150 mg total) by mouth daily.   Unknown   loratadine (CLARITIN) 10 MG tablet  Take 10 mg by mouth daily.   Unknown   Prenatal Vit-Fe Fumarate-FA (MULTIVITAMIN-PRENATAL) 27-0.8 MG TABS tablet Take 1 tablet by mouth daily at 12 noon. (Patient not taking: Reported on 04/24/2021)   Not Taking    Review of Systems  Constitutional:  Negative for chills and fever.  HENT:  Negative for congestion and sore throat.   Eyes:  Negative for photophobia and visual disturbance.  Respiratory:  Positive for chest tightness. Negative for cough and shortness of breath.   Cardiovascular:  Negative for chest pain and leg swelling.  Gastrointestinal:  Negative for abdominal pain, constipation, diarrhea, nausea and vomiting.  Genitourinary:  Positive for vaginal bleeding. Negative for dysuria, vaginal discharge and vaginal pain.       Minimal lochia.  Musculoskeletal:  Negative for back pain, neck pain and neck stiffness.  Skin:  Negative for rash.  Neurological:  Positive for headaches. Negative for dizziness, seizures, syncope and light-headedness.  Psychiatric/Behavioral:  Positive for sleep disturbance. The patient is nervous/anxious.   Physical Exam   Blood pressure (!) 146/83, pulse 91, temperature 97.8 F (36.6 C), temperature source Oral, resp. rate 16, weight 122.9 kg, SpO2 97 %, not currently breastfeeding.  Physical Exam Gen: well-appearing, NAD, tearful at times HEENT: pupils equal and reactive to light, moist mucous membranes Neck: non-tender, full ROM Chest: non-tender to palpation Cardiac: normal HR Resp: normal work of breathing Abd: surgical incision healing well with steri-strips in place. No surrounding erythema or edema. Neuro: A&O x3. CN II-XII intact. Psych: tearful, reports anxiety but denies safety concerns  MAU Course   Pt reports improvement in HA from 6/10 to 3-4/10 s/p tylenol. Will administer Fioricet and re-evaluate.  Reassuringly, preeclampsia labs wnl except for mild anemia. Negative troponin. Pro-BNP still pending. Pt with multiple mild range Bps  in MAU but no severe range Bps.  ProBNP wnl. Pt now reports complete resolution of HA s/p fioricet.   Discussed pt with Dr. Donavan Foil & Dr. Billy Coast who agree with plan to discharge pt home with new script for procardia 30mg  daily in addition to close f/u for BP check in clinic on 7/15.  Assessment and Plan  Alicia Christensen is POD#5 s/p repeat Cesarean presenting for concern of intermittent headaches since 04/22/21 in addition to elevated blood pressures at home to 160s-170s systolic. Reassuringly, BP in high normal to mild range in MAU. No concerning signs/symptoms of preeclampsia on exam or labs. HA completely resolved s/p tylenol and fioricet. Presentation consistent with new diagnosis of gestational hypertension.  Gestational Hypertension in Postpartum Period: -discussed pt's presentation with Dr. 06/23/21 as noted above -discharge home on procardia 30mg  daily with plan for BP check in clinic on Friday. -instructed pt to bring BP cuff to clinic on 7/15 -strict return precautions for signs/symptoms of preeclampsia including severe HA, vision changes, or worsening blood  pressures despite starting procardia  Sheila Oats 04/24/2021, 11:56 PM

## 2021-04-25 LAB — COMPREHENSIVE METABOLIC PANEL
ALT: 21 U/L (ref 0–44)
AST: 21 U/L (ref 15–41)
Albumin: 2.8 g/dL — ABNORMAL LOW (ref 3.5–5.0)
Alkaline Phosphatase: 85 U/L (ref 38–126)
Anion gap: 7 (ref 5–15)
BUN: 12 mg/dL (ref 6–20)
CO2: 24 mmol/L (ref 22–32)
Calcium: 8.7 mg/dL — ABNORMAL LOW (ref 8.9–10.3)
Chloride: 106 mmol/L (ref 98–111)
Creatinine, Ser: 0.69 mg/dL (ref 0.44–1.00)
GFR, Estimated: >60 mL/min (ref >60)
Glucose, Bld: 125 mg/dL — ABNORMAL HIGH (ref 70–99)
Potassium: 4.1 mmol/L (ref 3.5–5.1)
Sodium: 137 mmol/L (ref 135–145)
Total Bilirubin: 0.6 mg/dL (ref 0.3–1.2)
Total Protein: 6.1 g/dL — ABNORMAL LOW (ref 6.5–8.1)

## 2021-04-25 LAB — PROTEIN / CREATININE RATIO, URINE
Creatinine, Urine: 26.55 mg/dL
Total Protein, Urine: <6 mg/dL

## 2021-04-25 LAB — CBC
HCT: 33.1 % — ABNORMAL LOW (ref 36.0–46.0)
Hemoglobin: 10.4 g/dL — ABNORMAL LOW (ref 12.0–15.0)
MCH: 25.6 pg — ABNORMAL LOW (ref 26.0–34.0)
MCHC: 31.4 g/dL (ref 30.0–36.0)
MCV: 81.5 fL (ref 80.0–100.0)
Platelets: 307 10*3/uL (ref 150–400)
RBC: 4.06 MIL/uL (ref 3.87–5.11)
RDW: 16.6 % — ABNORMAL HIGH (ref 11.5–15.5)
WBC: 8.7 10*3/uL (ref 4.0–10.5)
nRBC: 0 % (ref 0.0–0.2)

## 2021-04-25 LAB — TROPONIN I (HIGH SENSITIVITY): Troponin I (High Sensitivity): 9 ng/L (ref <18)

## 2021-04-25 LAB — BRAIN NATRIURETIC PEPTIDE: B Natriuretic Peptide: 56.9 pg/mL (ref 0.0–100.0)

## 2021-04-25 MED ORDER — BUTALBITAL-APAP-CAFFEINE 50-325-40 MG PO TABS
1.0000 | ORAL_TABLET | Freq: Once | ORAL | Status: AC
  Administered 2021-04-25: 1 via ORAL
  Filled 2021-04-25: qty 1

## 2021-04-25 MED ORDER — NIFEDIPINE ER OSMOTIC RELEASE 30 MG PO TB24
30.0000 mg | ORAL_TABLET | Freq: Every day | ORAL | 0 refills | Status: DC
Start: 2021-04-25 — End: 2022-07-20

## 2021-04-25 MED ORDER — ACETAMINOPHEN 325 MG PO TABS: 650.0000 mg | ORAL_TABLET | Freq: Four times a day (QID) | ORAL | Status: AC | PRN

## 2021-04-25 MED ORDER — ACETAMINOPHEN 500 MG PO TABS
1000.0000 mg | ORAL_TABLET | Freq: Once | ORAL | Status: AC
  Administered 2021-04-25: 1000 mg via ORAL
  Filled 2021-04-25: qty 2

## 2021-04-25 NOTE — Discharge Instructions (Signed)
Your preeclampsia labs were normal. Start procardia 30mg  daily. Follow-up in clinic on Friday for a blood pressure check or return sooner if concern of severe headache, vision changes or other concerns.

## 2021-05-02 ENCOUNTER — Telehealth (HOSPITAL_COMMUNITY): Payer: Self-pay | Admitting: *Deleted

## 2021-05-02 NOTE — Telephone Encounter (Signed)
Mom reports feeling ok, but a little stressed because moving into new home. Incision is healing well. Was readmitted since delivery for BP issues, but back home now. EPDS = 4 (hospital score was 1). No other concerns about herself. Mom reports baby is doing well. Formula feeding without difficulty. Peeing and pooping well. No concerns about baby.  Duffy Rhody, RN 05/02/2021 at 10:21am

## 2021-11-12 ENCOUNTER — Telehealth: Payer: BC Managed Care – PPO

## 2022-04-02 DIAGNOSIS — I429 Cardiomyopathy, unspecified: Secondary | ICD-10-CM | POA: Insufficient documentation

## 2022-06-13 ENCOUNTER — Telehealth: Payer: Self-pay

## 2022-06-13 NOTE — Telephone Encounter (Signed)
NOTES SCANNED TO REFERRAL 

## 2022-07-20 ENCOUNTER — Ambulatory Visit: Payer: BC Managed Care – PPO | Attending: Cardiovascular Disease | Admitting: Cardiovascular Disease

## 2022-07-20 ENCOUNTER — Encounter: Payer: Self-pay | Admitting: Cardiovascular Disease

## 2022-07-20 VITALS — BP 120/88 | HR 94 | Ht 65.0 in | Wt 271.2 lb

## 2022-07-20 DIAGNOSIS — I429 Cardiomyopathy, unspecified: Secondary | ICD-10-CM | POA: Diagnosis not present

## 2022-07-20 DIAGNOSIS — I5042 Chronic combined systolic (congestive) and diastolic (congestive) heart failure: Secondary | ICD-10-CM | POA: Diagnosis not present

## 2022-07-20 MED ORDER — EMPAGLIFLOZIN 10 MG PO TABS: 10.0000 mg | ORAL_TABLET | Freq: Every day | ORAL | 1 refills | Status: DC

## 2022-07-20 MED ORDER — SACUBITRIL-VALSARTAN 49-51 MG PO TABS: 1.0000 | ORAL_TABLET | Freq: Two times a day (BID) | ORAL | 11 refills | Status: DC

## 2022-07-20 NOTE — Progress Notes (Signed)
Cardiology Office Note:    Date:  07/20/2022   ID:  Alicia Christensen, DOB [birth year], MRN [id number]  PCP:  Alicia Christensen   Alicia Christensen     Referring MD: Alicia Moan, MD   No chief complaint on file. Alicia Christensen is a 36 y.o. female who is being seen today for the evaluation of CHF at the request of Alicia Moan, MD.   History of Present Illness:    Alicia Christensen is a 36 y.o. female with a hx of recently diagnosed congestive heart failure due to severe cardiomyopathy (left ventricular systolic function around 68%).  She was previously evaluated by Alicia Christensen, but is seeking to transfer care to our practice.  Alicia Christensen delivered her second child, a baby boy in July 2022.  She had been fairly sick during the pregnancy and had only gained about 5 pounds.  Immediately after delivery she required hospitalization for severely elevated blood pressure and preeclampsia.  She was subsequently gradually weaned off the antihypertensive medications and did well for the next roughly 6 months.  Her prepregnancy weight was about 250 pounds.  She was in generally good health until the spring when she developed exertional dyspnea and a persistent cough.  She was treated with inhalers and steroids and antibiotics without any improvement.  After roughly 6 weeks of treatment labs showed an elevated D-dimer and she was referred for CT urography to exclude pulmonary embolism.  There was no evidence of pulmonary embolism, but she had evidence of congestive heart failure.  Follow-up echocardiography showed severely depressed left ventricular systolic function with EF 20%, dilated left ventricle (end-diastolic diameter 6.7 cm), dilated left atrium (end-systolic diameter 5.1 cm), moderate to severe mitral regurgitation, moderate to severe tricuspid regurgitation, diastolic dysfunction with elevation of left atrial filling pressures and a restrictive pattern of filling (mitral  deceleration time 144 ms).  There was no evidence of coronary or aortic atherosclerosis on the CT of the chest or the subsequently performed CT of the abdomen and pelvis.  Once the diagnosis was established was started on treatment with furosemide, Entresto and carvedilol and she feels much better.  Most days she has NYHA functional class I symptoms, never worse than NYHA functional class II.  She does not have lower extremity edema (but did not have edema at any point).  Denies orthopnea or PND.  She has not had dizziness, palpitations or syncope.  She does not have chest pain at rest or with activity.  She has not had any focal neurological complaints.  She continues to have borderline resting tachycardia and at her last cardiology appointment her dose of carvedilol was doubled to 6.25 mg twice daily.  She is still taking the original dose of Entresto.  She has not been started on SGLT2 inhibitors or spironolactone yet.  He is taking furosemide 40 mg twice daily.  She was scheduled for an MRI of the heart but this has not been performed.  She also has significant right upper quadrant pain around the time of her diagnosis of cardiomyopathy.  She did have a gallstone identified by CT and confirmed by ultrasound.  Alicia Christensen is also my patient.  He has nonischemic cardiomyopathy with onset after the age of 19.  He had a remarkable improvement in LV systolic function from a low of 25% (by MRI) to normal range at 60 to 65% by echocardiography after taking 4 drug guideline directed medical therapy.  He also  has morbid obesity and obesity hypoventilation syndrome.  Past Medical History:  Diagnosis Date   Environmental allergies    Frequent headaches    Gestational diabetes    Hx of varicella    Migraine    Seasonal allergies    Vaginal Pap smear, abnormal     Past Surgical History:  Procedure Laterality Date   CESAREAN SECTION N/A 01/15/2016   Procedure: CESAREAN SECTION;   Surgeon: Alicia Few, MD;  Location: [facility];  Service: Obstetrics;  Laterality: N/A;   CESAREAN SECTION N/A 04/19/2021   Procedure: Repeat CESAREAN SECTION;  Surgeon: Alicia Few, MD;  Location: [facility];  Service: Obstetrics;  Laterality: N/A;  EDD: 04/29/21 Ok per Alicia Christensen.   DILATION AND CURETTAGE OF UTERUS     WISDOM TOOTH EXTRACTION      Current Medications: Current Meds  Medication Sig   acetaminophen (TYLENOL) 325 MG tablet Take 2 tablets (650 mg total) by mouth every 6 (six) hours as needed for headache, fever, moderate pain or mild pain.   carvedilol (COREG) 6.25 MG tablet Take one tablet (6.25 mg dose) by mouth 2 (two) times daily.   cetirizine (ZYRTEC) 10 MG chewable tablet Chew by mouth.   diphenhydrAMINE (BENADRYL) 25 MG tablet Take 25 mg by mouth every 6 (six) hours as needed for itching.   ENTRESTO 24-26 MG SMARTSIG:1 Tablet(s) By Mouth Every Evening   fluticasone (FLONASE) 50 MCG/ACT nasal spray Place 1 spray into both nostrils daily as needed for allergies.   furosemide (LASIX) 20 MG tablet Take 20 mg by mouth 2 (two) times daily. Take 2 tablets 2 times daily   iron polysaccharides (NIFEREX) 150 MG capsule Take 1 capsule (150 mg total) by mouth daily.   senna-docusate (SENOKOT-S) 8.6-50 MG tablet Take 2 tablets by mouth at bedtime as needed for mild constipation.     Allergies:   Amoxicillin, Hydrocodeine [dihydrocodeine], and Penicillins   Social History   Socioeconomic History   Marital status: Married    Spouse name: Not on file   Number of children: Not on file   Years of education: Not on file   Highest education level: Not on file  Occupational History   Not on file  Tobacco Use   Smoking status: Never   Smokeless tobacco: Never  Vaping Use   Vaping Use: Never used  Substance and Sexual Activity   Alcohol use: Yes    Alcohol/week: 0.0 standard drinks of alcohol    Comment: rare -- 3 x month   Drug use: No   Sexual activity: Yes    Partners: Male   Other Topics Concern   Not on file  Social History Narrative   Not on file   Social Determinants of Health   Financial Resource Strain: Not on file  Food Insecurity: Not on file  Transportation Needs: Not on file  Physical Activity: Not on file  Stress: Not on file  Social Connections: Not on file     Family History: The patient's family history includes Alcoholism in her father; Diabetes in her mother and paternal grandfather; Heart attack in her maternal grandfather and paternal grandfather; Heart disease in her paternal aunt; Hyperlipidemia in her mother; Hypertension in her maternal grandmother; Varicose Veins in her mother.  ROS:   Please see the history of present illness.     All other systems reviewed and are negative.  EKGs/Labs/Other Studies Reviewed:    The following studies were reviewed today: Report of echocardiogram from [facility] 03/27/2022  Adequate 2D M-mode and color flow Doppler echocardiogram demonstrates  prominently enlarged left and right ventricular chamber size.  There is  prominent global hypokinesis with ejection fraction estimated at  approximately 20%.  These findings are consistent with a dilated  cardiomyopathy  2.  The aortic, mitral, and tricuspid valves have grossly normal  appearance  3.  There is significant mitral and tricuspid insufficiency  4.  The atria are of roughly normal size bilaterally  5.  Pulmonary hypertension is present  6.  Right ventricular systolic function is good  7.  The pericardium is of normal thickness there is a trivial  nonhemodynamically significant pericardial effusion.  LA Volume Index (BP) mL/m2 36.1   LA A-P Dimension cm 5.100   IVSd 0.9 - 1.6 cm 1.1   LVIDd 7.29 - 10.13 cm 6.770   LVIDs 4.22 - 6.39 cm 6.060   LVPWd 0.86 - 1.61 cm 1.110   MV E' Tissue Velocity Lateral cm/s 3.480   A4C EF % 19   Left Ventricular EF by 2-D Biplane by Method of Disks % 28.100   E/E' Lateral Ratio no units 14.500      EKG:  EKG is  ordered today.  The ekg ordered today demonstrates normal sinus rhythm, voltage criteria for left ventricular hypertrophy but only minor nonspecific T wave flattening, QTc prolonged at 487 ms  Recent Labs: No results found for requested labs within last 365 days.  03/22/2022-04/03/2022 Sodium 137, potassium 4.0, creatinine 0.88 Hemoglobin 12.2 Troponin T 12-13 Rheumatoid factor and ANA negative, ESR 15 Normal transaminases, alkaline phosphatase and bilirubin TSH 1.22 Recent Lipid Panel No results found for: "CHOL", "TRIG", "HDL", "CHOLHDL", "VLDL", "LDLCALC", "LDLDIRECT" 06/11/2011 Cholesterol 110, HDL 27, LDL 55.6, triglycerides 139  Risk Assessment/Calculations:                Physical Exam:    VS:  BP 120/88   Pulse 94   Ht $R'5\' 5"'Ly$  (1.651 m)   Wt 271 lb 3.2 oz (123 kg)   SpO2 98%   BMI 45.13 kg/m     Wt Readings from Last 3 Encounters:  07/20/22 271 lb 3.2 oz (123 kg)  04/24/21 271 lb (122.9 kg)  04/19/21 280 lb (127 kg)     GEN: Morbidly obese.  Well nourished, well developed in no acute distress HEENT: Normal NECK: No JVD; No carotid bruits LYMPHATICS: No lymphadenopathy CARDIAC: RRR, no murmurs, rubs, S3 gallop is present. RESPIRATORY:  Clear to auscultation without rales, wheezing or rhonchi  ABDOMEN: Soft, non-tender, non-distended MUSCULOSKELETAL:  No edema; No deformity  SKIN: Warm and dry NEUROLOGIC:  Alert and oriented x 3 PSYCHIATRIC:  Normal affect   ASSESSMENT:    No diagnosis found. PLAN:    In order of problems listed above:  CHF: She appears well compensated.  Body habitus makes assessment for hypovolemia little challenging, but she does not have any visible edema or jugular venous distention and her lungs are clear.  There is a third heart sound on physical exam suggesting that she still has elevated left heart filling pressures.  Reviewed the importance of sodium restricted diet and daily weight monitoring.  Discussed the  complex pharmacological therapy for heart failure involving maximum tolerated doses of Entresto and carvedilol as well as SGLT2 inhibitors and spironolactone.  Today we will add Jardiance and increase the Entresto to the 49/51 mg twice daily dose.  Stop the furosemide when she makes these medication changes, but restarted if she gains more than 3  pounds in 24 hours or 5 pounds in a week.  Plan to add spironolactone if labs and blood pressure permits at the next appointment.  Check labs in a couple of weeks.  Repeat an echocardiogram. Cardiomyopathy: Mechanism is unclear.  Do not suspect coronary artery disease in this young premenopausal woman without chest pain, but evaluation of the coronary arteries will be important at one point.  Her father had nonischemic cardiomyopathy with severely depressed LVEF that responded remarkably well to medical therapy, so it is possible there is a inheritable cardiomyopathy.  She had preeclampsia immediately after delivering her son more than a year ago, but her blood pressure is now very normal.  It is possible she had postpartum cardiomyopathy that did not manifest itself so noisily early on, but really her symptoms of heart failure only became evident 8 months or so after delivery, making postpartum cardiomyopathy unlikely.  Regardless, I strongly advised against further pregnancy.  She has no intention of having more children.  At some point, when we have reached the limits of medical therapy and reevaluated her LVEF by echo, would probably recommend cardiac MRI.            Medication Adjustments/Labs and Tests Ordered: Current medicines are reviewed at length with the patient today.  Concerns regarding medicines are outlined above.  No orders of the defined types were placed in this encounter.  No orders of the defined types were placed in this encounter.   There are no Patient Instructions on file for this visit.   Signed, Sanda Klein, MD  07/20/2022  4:12 PM    [place]

## 2022-07-20 NOTE — Patient Instructions (Signed)
Medication Instructions:  START Jardiance 10 mg once daily  INCREASE the Entresto to 49-51 mg twice daily  *If you need a refill on your cardiac medications before your next appointment, please call your pharmacy*   Lab Work: Your provider would like for you to return in 2 weeks to have the following labs drawn: BMET and BN. You do not need an appointment for the lab. Once in our office lobby there is a podium where you can sign in and ring the doorbell to alert [place] that you are here. The lab is open from 8:00 am to 4 pm; closed for lunch from 12:45pm-1:45pm.  You may also go to any of these [facility] locations:   [facility] [name]    If you have labs (blood work) drawn today and your tests are completely normal, you will receive your results only by: [employer] (if you have MyChart) OR A paper copy in the mail If you have any lab test that is abnormal or we need to change your treatment, we will call you to review the results.   Testing/Procedures: None ordered   Follow-Up: At [facility], you and your health needs are our priority.  As part of our continuing mission to provide you with exceptional heart care, we have created designated Provider Care Teams.  These Care Teams include your primary Cardiologist (physician) and Advanced Practice Providers (APPs -  Physician Assistants and Nurse Practitioners) who all work together to provide you with the care you need, when you need it.  We recommend signing up for the patient portal called "MyChart".  Sign up information is provided on this After Visit Summary.  MyChart is used to connect with patients for Virtual Visits (Telemedicine).  Patients are able to view lab/test results, encounter notes, upcoming appointments, etc.  Non-urgent messages can be sent to your provider as well.   To learn more about what you  can do with MyChart, go to [email or web address].    Your next appointment:   1 month(s)  The format for your next appointment:   In Person  Provider:   Dr. Sallyanne Kuster

## 2022-07-23 ENCOUNTER — Telehealth: Payer: Self-pay | Admitting: Cardiovascular Disease

## 2022-07-23 NOTE — Telephone Encounter (Signed)
Pt c/o medication issue:  1. Name of Medication: empagliflozin (JARDIANCE) 10 MG TABS tablet  2. How are you currently taking this medication (dosage and times per day)? Take 1 tablet (10 mg total) by mouth daily before breakfast.  3. Are you having a reaction (difficulty breathing--STAT)? no  4. What is your medication issue? Patient  states she was started on this new medication on Friday.  She has been off of lasix since Friday and she has gained 7lbs since then, she can't remember if she is to go back on the lasix with the weight gain.  She is calling to make sure. Please advise.

## 2022-07-23 NOTE — Telephone Encounter (Signed)
Returned the call to the patient. She stated that since stopping the furosemide, she has gained 7 pounds on her home scale. She denies shortness of breath. Current weight is 277 pounds.   She was calling to see if she should take any Furosemide and for how long.  Per office note: Stop the furosemide when she makes these medication changes, but restarted if she gains more than 3 pounds in 24 hours or 5 pounds in a week.  She has been advised to go ahead and take a tablet.

## 2022-07-24 MED ORDER — FUROSEMIDE 20 MG PO TABS: 20.0000 mg | ORAL_TABLET | Freq: Every day | ORAL | Status: DC

## 2022-07-24 NOTE — Telephone Encounter (Signed)
Please continue the furosemide daily. Let us know if her weight does not go back down in  3-4 days

## 2022-07-24 NOTE — Telephone Encounter (Signed)
Patient has been made aware and verbalized her understanding.  

## 2022-08-07 LAB — BASIC METABOLIC PANEL
BUN/Creatinine Ratio: 15 (ref 9–23)
BUN: 9 mg/dL (ref 6–20)
CO2: 22 mmol/L (ref 20–29)
Calcium: 9.4 mg/dL (ref 8.7–10.2)
Chloride: 100 mmol/L (ref 96–106)
Creatinine, Ser: 0.61 mg/dL (ref 0.57–1.00)
Glucose: 138 mg/dL — ABNORMAL HIGH (ref 70–99)
Potassium: 4 mmol/L (ref 3.5–5.2)
Sodium: 139 mmol/L (ref 134–144)
eGFR: 120 mL/min/{1.73_m2} (ref >59)

## 2022-08-07 LAB — PRO B NATRIURETIC PEPTIDE: NT-Pro BNP: 440 pg/mL — ABNORMAL HIGH (ref 0–130)

## 2022-08-16 ENCOUNTER — Telehealth: Payer: BC Managed Care – PPO | Admitting: Physician Assistant

## 2022-08-16 DIAGNOSIS — J208 Acute bronchitis due to other specified organisms: Secondary | ICD-10-CM

## 2022-08-16 DIAGNOSIS — B9689 Other specified bacterial agents as the cause of diseases classified elsewhere: Secondary | ICD-10-CM | POA: Diagnosis not present

## 2022-08-16 MED ORDER — DOXYCYCLINE HYCLATE 100 MG PO TABS: 100.0000 mg | ORAL_TABLET | Freq: Two times a day (BID) | ORAL | 0 refills | Status: DC

## 2022-08-16 NOTE — Progress Notes (Signed)
We are sorry that you are not feeling well.  Here is how we plan to help!  Based on your presentation I believe you most likely have A cough due to bacteria.  When patients have a productive cough with a change in color or increased sputum production, we are concerned about bacterial bronchitis.  If left untreated it can progress to pneumonia.  If your symptoms do not improve with your treatment plan it is important that you contact your provider.   I have prescribed Doxycycline 100 mg twice a day for 7 days      From your responses in the eVisit questionnaire you describe inflammation in the upper respiratory tract which is causing a significant cough.  This is commonly called Bronchitis and has four common causes:   Allergies Viral Infections Acid Reflux Bacterial Infection Allergies, viruses and acid reflux are treated by controlling symptoms or eliminating the cause. An example might be a cough caused by taking certain blood pressure medications. You stop the cough by changing the medication. Another example might be a cough caused by acid reflux. Controlling the reflux helps control the cough.  USE OF BRONCHODILATOR ("RESCUE") INHALERS: There is a risk from using your bronchodilator too frequently.  The risk is that over-reliance on a medication which only relaxes the muscles surrounding the breathing tubes can reduce the effectiveness of medications prescribed to reduce swelling and congestion of the tubes themselves.  Although you feel brief relief from the bronchodilator inhaler, your asthma may actually be worsening with the tubes becoming more swollen and filled with mucus.  This can delay other crucial treatments, such as oral steroid medications. If you need to use a bronchodilator inhaler daily, several times per day, you should discuss this with your provider.  There are probably better treatments that could be used to keep your asthma under control.     HOME CARE Only take medications  as instructed by your medical team. Complete the entire course of an antibiotic. Drink plenty of fluids and get plenty of rest. Avoid close contacts especially the very young and the elderly Cover your mouth if you cough or cough into your sleeve. Always remember to wash your hands A steam or ultrasonic humidifier can help congestion.   GET HELP RIGHT AWAY IF: You develop worsening fever. You become short of breath You cough up blood. Your symptoms persist after you have completed your treatment plan MAKE SURE YOU  Understand these instructions. Will watch your condition. Will get help right away if you are not doing well or get worse.    Thank you for choosing an e-visit.  Your e-visit answers were reviewed by a board certified advanced clinical practitioner to complete your personal care plan. Depending upon the condition, your plan could have included both over the counter or prescription medications.  Please review your pharmacy choice. Make sure the pharmacy is open so you can pick up prescription now. If there is a problem, you may contact your provider through [employer] and have the prescription routed to another pharmacy.  Your safety is important to [place]. If you have drug allergies check your prescription carefully.   For the next 24 hours you can use MyChart to ask questions about today's visit, request a non-urgent call back, or ask for a work or school excuse. You will get an email in the next two days asking about your experience. I hope that your e-visit has been valuable and will speed your recovery.

## 2022-08-16 NOTE — Progress Notes (Signed)
I have spent 5 minutes in review of e-visit questionnaire, review and updating patient chart, medical decision making and response to patient.   Demaya Hardge Cody Haani Bakula, PA-C    

## 2022-08-22 MED ORDER — HYDROXYZINE PAMOATE 25 MG PO CAPS: 25.0000 mg | ORAL_CAPSULE | Freq: Three times a day (TID) | ORAL | 0 refills | Status: DC | PRN

## 2022-08-22 MED ORDER — AZITHROMYCIN 250 MG PO TABS: ORAL_TABLET | ORAL | 0 refills | Status: AC

## 2022-08-22 NOTE — Addendum Note (Signed)
Addended by: Waldon Merl on: 08/22/2022 10:01 AM   Modules accepted: Orders

## 2022-08-24 ENCOUNTER — Ambulatory Visit: Payer: BC Managed Care – PPO | Attending: Cardiovascular Disease | Admitting: Cardiovascular Disease

## 2022-08-24 ENCOUNTER — Encounter: Payer: Self-pay | Admitting: Cardiovascular Disease

## 2022-08-24 VITALS — BP 102/80 | HR 98 | Ht 64.5 in | Wt 271.4 lb

## 2022-08-24 DIAGNOSIS — Z79899 Other long term (current) drug therapy: Secondary | ICD-10-CM

## 2022-08-24 DIAGNOSIS — I429 Cardiomyopathy, unspecified: Secondary | ICD-10-CM | POA: Diagnosis not present

## 2022-08-24 DIAGNOSIS — I5042 Chronic combined systolic (congestive) and diastolic (congestive) heart failure: Secondary | ICD-10-CM | POA: Diagnosis not present

## 2022-08-24 MED ORDER — SPIRONOLACTONE 25 MG PO TABS: 12.5000 mg | ORAL_TABLET | Freq: Every day | ORAL | 0 refills | Status: DC

## 2022-08-24 MED ORDER — CARVEDILOL 12.5 MG PO TABS: 12.5000 mg | ORAL_TABLET | Freq: Two times a day (BID) | ORAL | 3 refills | Status: DC

## 2022-08-24 NOTE — Patient Instructions (Addendum)
Medication Instructions:  INCREASE Carvedilol (Coreg) to 12.5 mg 2 times a day  START Spironolactone 12.5 mg daily   *If you need a refill on your cardiac medications before your next appointment, please call your pharmacy*  Lab Work: Your physician recommends that you return for lab work in 1 month when Echocardiogram is scheduled:  BMP  If you have labs (blood work) drawn today and your tests are completely normal, you will receive your results only by: MyChart Message (if you have MyChart) OR A paper copy in the mail If you have any lab test that is abnormal or we need to change your treatment, we will call you to review the results.   Testing/Procedures: Your physician has requested that you have an echocardiogram. Echocardiography is a painless test that uses sound waves to create images of your heart. It provides your doctor with information about the size and shape of your heart and how well your heart's chambers and valves are working. This procedure takes approximately one hour. There are no restrictions for this procedure. Please do NOT wear cologne, perfume, aftershave, or lotions (deodorant is allowed). Please arrive 15 minutes prior to your appointment time.  Please schedule for 1 month   Follow-Up: At [facility], you and your health needs are our priority.  As part of our continuing mission to provide you with exceptional heart care, we have created designated Provider Care Teams.  These Care Teams include your primary Cardiologist (physician) and Advanced Practice Providers (APPs -  Physician Assistants and Nurse Practitioners) who all work together to provide you with the care you need, when you need it.  Your next appointment:   After  Echocardiogram    The format for your next appointment:   In Person  Provider:   Thurmon Fair, MD  or  APP         Other Instructions  Important Information About Sugar

## 2022-08-24 NOTE — Progress Notes (Signed)
Cardiology Office Note:    Date:  08/24/2022   ID:  Alicia Christensen, DOB 1986/07/02, MRN [id number]  PCP:  Kathyrn Lass   [facility] Providers Cardiologist:  Sanda Klein, MD     Referring MD: No ref. provider found   No chief complaint on file. Alicia Christensen is a 36 y.o. female who is being seen today for the evaluation of CHF at the request of No ref. provider found.   History of Present Illness:    Alicia Christensen is a 35 y.o. female with a hx of recently diagnosed congestive heart failure due to severe cardiomyopathy (left ventricular systolic function around 70%).   She is now on the medium dose of Entresto 49/51 as well as a moderate dose of carvedilol and an SGLT2 inhibitor.  Tried to stop the loop diuretic when started the SGLT2 inhibitor, but probably gained 12 pounds over the weekend so has restarted the diuretic.  Clinically appears euvolemic and has NYHA functional class II exertional dyspnea.  Her blood pressure is 102/80 but she denies dizziness, presyncope or syncope or fatigue.  She continues to work as a [employer].  She is following a low-sodium diet.  Alicia Christensen delivered her second child, a baby boy in July 2022.  She had been fairly sick during the pregnancy and had only gained about 5 pounds.  Immediately after delivery she required hospitalization for severely elevated blood pressure and preeclampsia.  She was subsequently gradually weaned off the antihypertensive medications and did well for the next roughly 6 months.  Her prepregnancy weight was about 250 pounds.  She was in generally good health until the spring when she developed exertional dyspnea and a persistent cough.  She was treated with inhalers and steroids and antibiotics without any improvement.  After roughly 6 weeks of treatment labs showed an elevated D-dimer and she was referred for CT urography to exclude pulmonary embolism.  There was no evidence of pulmonary embolism, but she had  evidence of congestive heart failure.  Follow-up echocardiography showed severely depressed left ventricular systolic function with EF 20%, dilated left ventricle (end-diastolic diameter 6.7 cm), dilated left atrium (end-systolic diameter 5.1 cm), moderate to severe mitral regurgitation, moderate to severe tricuspid regurgitation, diastolic dysfunction with elevation of left atrial filling pressures and a restrictive pattern of filling (mitral deceleration time 144 ms).  There was no evidence of coronary or aortic atherosclerosis on the CT of the chest or the subsequently performed CT of the abdomen and pelvis.  She was scheduled for an MRI of the heart but this has not been performed.  She also has significant right upper quadrant pain around the time of her diagnosis of cardiomyopathy.  She did have a gallstone identified by CT and confirmed by ultrasound.  Alicia Christensen's father Alicia Christensen is also my patient.  He has nonischemic cardiomyopathy with onset after the age of 60.  He had a remarkable improvement in LV systolic function from a low of 25% (by MRI) to normal range at 60 to 65% by echocardiography after taking 4 drug guideline directed medical therapy.  He also has morbid obesity and obesity hypoventilation syndrome.  Past Medical History:  Diagnosis Date   Environmental allergies    Frequent headaches    Gestational diabetes    Hx of varicella    Migraine    Seasonal allergies    Vaginal Pap smear, abnormal     Past Surgical History:  Procedure Laterality Date   CESAREAN  SECTION N/A 01/15/2016   Procedure: CESAREAN SECTION;  Surgeon: [name], MD;  Location: [facility];  Service: Obstetrics;  Laterality: N/A;   CESAREAN SECTION N/A 04/19/2021   Procedure: Repeat CESAREAN SECTION;  Surgeon: [name], MD;  Location: [facility];  Service: Obstetrics;  Laterality: N/A;  EDD: 04/29/21 Ok per Lattie Haw.   DILATION AND CURETTAGE OF UTERUS     WISDOM TOOTH EXTRACTION      Current  Medications: Current Meds  Medication Sig   acetaminophen (TYLENOL) 325 MG tablet Take 2 tablets (650 mg total) by mouth every 6 (six) hours as needed for headache, fever, moderate pain or mild pain.   azithromycin (ZITHROMAX) 250 MG tablet Take 2 tablets on day 1, then 1 tablet daily on days 2 through 5   cetirizine (ZYRTEC) 10 MG chewable tablet Chew 10 mg by mouth daily.   empagliflozin (JARDIANCE) 10 MG TABS tablet Take 1 tablet (10 mg total) by mouth daily before breakfast.   Ferrous Sulfate (IRON PO) Take 1 tablet by mouth every other day.   furosemide (LASIX) 20 MG tablet Take 20 mg by mouth 2 (two) times daily.   hydrOXYzine (VISTARIL) 25 MG capsule Take 1 capsule (25 mg total) by mouth every 8 (eight) hours as needed.   sacubitril-valsartan (ENTRESTO) 49-51 MG Take 1 tablet by mouth 2 (two) times daily.   spironolactone (ALDACTONE) 25 MG tablet Take 0.5 tablets (12.5 mg total) by mouth daily.   [DISCONTINUED] carvedilol (COREG) 6.25 MG tablet Take one tablet (6.25 mg dose) by mouth 2 (two) times daily.     Allergies:   Amoxicillin, Hydrocodeine [dihydrocodeine], and Penicillins   Social History   Socioeconomic History   Marital status: Married    Spouse name: Not on file   Number of children: Not on file   Years of education: Not on file   Highest education level: Not on file  Occupational History   Not on file  Tobacco Use   Smoking status: Never   Smokeless tobacco: Never  Vaping Use   Vaping Use: Never used  Substance and Sexual Activity   Alcohol use: Yes    Alcohol/week: 0.0 standard drinks of alcohol    Comment: rare -- 3 x month   Drug use: No   Sexual activity: Yes    Partners: Male  Other Topics Concern   Not on file  Social History Narrative   Not on file   Social Determinants of Health   Financial Resource Strain: Not on file  Food Insecurity: Not on file  Transportation Needs: Not on file  Physical Activity: Not on file  Stress: Not on file   Social Connections: Not on file     Family History: The patient's family history includes Alcoholism in her father; Diabetes in her mother and paternal grandfather; Heart attack in her maternal grandfather and paternal grandfather; Heart disease in her paternal aunt; Hyperlipidemia in her mother; Hypertension in her maternal grandmother; Varicose Veins in her mother.  ROS:   Please see the history of present illness.     All other systems reviewed and are negative.  EKGs/Labs/Other Studies Reviewed:    The following studies were reviewed today: Report of echocardiogram from [facility] 03/27/2022  Adequate 2D M-mode and color flow Doppler echocardiogram demonstrates  prominently enlarged left and right ventricular chamber size.  There is  prominent global hypokinesis with ejection fraction estimated at  approximately 20%.  These findings are consistent with a dilated  cardiomyopathy  2.  The aortic, mitral, and tricuspid valves have grossly normal  appearance  3.  There is significant mitral and tricuspid insufficiency  4.  The atria are of roughly normal size bilaterally  5.  Pulmonary hypertension is present  6.  Right ventricular systolic function is good  7.  The pericardium is of normal thickness there is a trivial  nonhemodynamically significant pericardial effusion.  LA Volume Index (BP) mL/m2 36.1   LA A-P Dimension cm 5.100   IVSd 0.9 - 1.6 cm 1.1   LVIDd 7.29 - 10.13 cm 6.770   LVIDs 4.22 - 6.39 cm 6.060   LVPWd 0.86 - 1.61 cm 1.110   MV E' Tissue Velocity Lateral cm/s 3.480   A4C EF % 19   Left Ventricular EF by 2-D Biplane by Method of Disks % 28.100   E/E' Lateral Ratio no units 14.500     EKG:  EKG is not ordered today.  The ekg ordered at the previous appointment demonstrates normal sinus rhythm, voltage criteria for left ventricular hypertrophy but only minor nonspecific T wave flattening, QTc prolonged at 487 ms  Recent Labs: 08/06/2022: BUN 9;  Creatinine, Ser 0.61; NT-Pro BNP 440; Potassium 4.0; Sodium 139  03/22/2022-04/03/2022 Sodium 137, potassium 4.0, creatinine 0.88 Hemoglobin 12.2 Troponin T 12-13 Rheumatoid factor and ANA negative, ESR 15 Normal transaminases, alkaline phosphatase and bilirubin TSH 1.22 Recent Lipid Panel No results found for: "CHOL", "TRIG", "HDL", "CHOLHDL", "VLDL", "LDLCALC", "LDLDIRECT" 06/11/2011 Cholesterol 110, HDL 27, LDL 55.6, triglycerides 139  Risk Assessment/Calculations:                Physical Exam:    VS:  BP 102/80 (BP Location: Left Arm, Patient Position: Sitting, Cuff Size: Large)   Pulse 98   Ht 5' 4.5" (1.638 m)   Wt 123.1 kg   SpO2 95%   BMI 45.87 kg/m     Wt Readings from Last 3 Encounters:  08/24/22 123.1 kg  07/20/22 123 kg  04/24/21 122.9 kg      General: Alert, oriented x3, no distress, morbidly obese Head: no evidence of trauma, PERRL, EOMI, no exophtalmos or lid lag, no myxedema, no xanthelasma; normal ears, nose and oropharynx Neck: normal jugular venous pulsations and no hepatojugular reflux; brisk carotid pulses without delay and no carotid bruits Chest: clear to auscultation, no signs of consolidation by percussion or palpation, normal fremitus, symmetrical and full respiratory excursions Cardiovascular: normal position and quality of the apical impulse, regular rhythm, normal first and second heart sounds, no murmurs, rubs or gallops Abdomen: no tenderness or distention, no masses by palpation, no abnormal pulsatility or arterial bruits, normal bowel sounds, no hepatosplenomegaly Extremities: no clubbing, cyanosis or edema; 2+ radial, ulnar and brachial pulses bilaterally; 2+ right femoral, posterior tibial and dorsalis pedis pulses; 2+ left femoral, posterior tibial and dorsalis pedis pulses; no subclavian or femoral bruits Neurological: grossly nonfocal Psych: Normal mood and affect   ASSESSMENT:    1. Chronic combined systolic and diastolic  congestive heart failure (HCC)   2. Cardiomyopathy, unspecified type ([place])   3. Medication management    PLAN:    In order of problems listed above:  CHF: Clinically euvolemic.  NYHA functional class II.  proBNP is only modestly elevated at 440.  Continues to have mild resting tachycardia.  I do not think she will tolerate a higher dose of Entresto, but we will try to increase her carvedilol and add a little bit of spironolactone.  Recheck labs and an  echocardiogram in about a month.   Cardiomyopathy: Mechanism is unclear.  Do not suspect coronary artery disease in this young premenopausal woman without chest pain, but evaluation of the coronary arteries will be important at one point.  Her father had nonischemic cardiomyopathy with severely depressed LVEF that responded remarkably well to medical therapy, so it is possible there is a inheritable cardiomyopathy.  She had preeclampsia immediately after delivering her son more than a year ago, but her blood pressure is now very normal.  It is possible she had postpartum cardiomyopathy that did not manifest itself so noisily early on, but really her symptoms of heart failure only became evident 8 months or so after delivery, making postpartum cardiomyopathy unlikely.  Regardless, I strongly advised against further pregnancy.  She has no intention of having more children.  At some point, when we have reached the limits of medical therapy and reevaluated her LVEF by echo, would probably recommend cardiac MRI.            Medication Adjustments/Labs and Tests Ordered: Current medicines are reviewed at length with the patient today.  Concerns regarding medicines are outlined above.  Orders Placed This Encounter  Procedures   Basic metabolic panel   ECHOCARDIOGRAM COMPLETE   Meds ordered this encounter  Medications   spironolactone (ALDACTONE) 25 MG tablet    Sig: Take 0.5 tablets (12.5 mg total) by mouth daily.    Dispense:  45 tablet    Refill:   0   carvedilol (COREG) 12.5 MG tablet    Sig: Take 1 tablet (12.5 mg total) by mouth 2 (two) times daily with a meal.    Dispense:  180 tablet    Refill:  3    Dose change new Rx    Patient Instructions  Medication Instructions:  INCREASE Carvedilol (Coreg) to 12.5 mg 2 times a day  START Spironolactone 12.5 mg daily   *If you need a refill on your cardiac medications before your next appointment, please call your pharmacy*  Lab Work: Your physician recommends that you return for lab work in 1 month when Echocardiogram is scheduled:  BMP  If you have labs (blood work) drawn today and your tests are completely normal, you will receive your results only by: MyChart Message (if you have MyChart) OR A paper copy in the mail If you have any lab test that is abnormal or we need to change your treatment, we will call you to review the results.   Testing/Procedures: Your physician has requested that you have an echocardiogram. Echocardiography is a painless test that uses sound waves to create images of your heart. It provides your doctor with information about the size and shape of your heart and how well your heart's chambers and valves are working. This procedure takes approximately one hour. There are no restrictions for this procedure. Please do NOT wear cologne, perfume, aftershave, or lotions (deodorant is allowed). Please arrive 15 minutes prior to your appointment time.  Please schedule for 1 month   Follow-Up: At [facility], you and your health needs are our priority.  As part of our continuing mission to provide you with exceptional heart care, we have created designated Provider Care Teams.  These Care Teams include your primary Cardiologist (physician) and Advanced Practice Providers (APPs -  Physician Assistants and Nurse Practitioners) who all work together to provide you with the care you need, when you need it.  Your next appointment:   After  Echocardiogram  The format for your next appointment:   In Person  Provider:   Sanda Klein, MD  or  APP         Other Instructions  Important Information About Sugar         Signed, Sanda Klein, MD  08/24/2022 9:52 AM    [place]

## 2022-09-04 ENCOUNTER — Encounter: Payer: Self-pay | Admitting: Cardiovascular Disease

## 2022-09-05 NOTE — Telephone Encounter (Signed)
OK to take 25 mg every other day.

## 2022-10-04 ENCOUNTER — Telehealth: Payer: BC Managed Care – PPO | Admitting: Physician Assistant

## 2022-10-04 DIAGNOSIS — J208 Acute bronchitis due to other specified organisms: Secondary | ICD-10-CM | POA: Diagnosis not present

## 2022-10-04 DIAGNOSIS — B9689 Other specified bacterial agents as the cause of diseases classified elsewhere: Secondary | ICD-10-CM | POA: Diagnosis not present

## 2022-10-05 MED ORDER — AZITHROMYCIN 250 MG PO TABS: ORAL_TABLET | ORAL | 0 refills | Status: AC

## 2022-10-05 NOTE — Progress Notes (Signed)
We are sorry that you are not feeling well.  Here is how we plan to help!  Based on your presentation I believe you most likely have A cough due to bacteria.  When patients have a fever and a productive cough with a change in color or increased sputum production, we are concerned about bacterial bronchitis.  If left untreated it can progress to pneumonia.  If your symptoms do not improve with your treatment plan it is important that you contact your provider.   I have prescribed Azithromyin 250 mg: two tablets now and then one tablet daily for 4 additonal days    In addition you may use A non-prescription cough medication called Mucinex DM: take 2 tablets every 12 hours.  From your responses in the eVisit questionnaire you describe inflammation in the upper respiratory tract which is causing a significant cough.  This is commonly called Bronchitis and has four common causes:   Allergies Viral Infections Acid Reflux Bacterial Infection Allergies, viruses and acid reflux are treated by controlling symptoms or eliminating the cause. An example might be a cough caused by taking certain blood pressure medications. You stop the cough by changing the medication. Another example might be a cough caused by acid reflux. Controlling the reflux helps control the cough.  USE OF BRONCHODILATOR ("RESCUE") INHALERS: There is a risk from using your bronchodilator too frequently.  The risk is that over-reliance on a medication which only relaxes the muscles surrounding the breathing tubes can reduce the effectiveness of medications prescribed to reduce swelling and congestion of the tubes themselves.  Although you feel brief relief from the bronchodilator inhaler, your asthma may actually be worsening with the tubes becoming more swollen and filled with mucus.  This can delay other crucial treatments, such as oral steroid medications. If you need to use a bronchodilator inhaler daily, several times per day, you should  discuss this with your provider.  There are probably better treatments that could be used to keep your asthma under control.     HOME CARE Only take medications as instructed by your medical team. Complete the entire course of an antibiotic. Drink plenty of fluids and get plenty of rest. Avoid close contacts especially the very young and the elderly Cover your mouth if you cough or cough into your sleeve. Always remember to wash your hands A steam or ultrasonic humidifier can help congestion.   GET HELP RIGHT AWAY IF: You develop worsening fever. You become short of breath You cough up blood. Your symptoms persist after you have completed your treatment plan MAKE SURE YOU  Understand these instructions. Will watch your condition. Will get help right away if you are not doing well or get worse.    Thank you for choosing an e-visit.  Your e-visit answers were reviewed by a board certified advanced clinical practitioner to complete your personal care plan. Depending upon the condition, your plan could have included both over the counter or prescription medications.  Please review your pharmacy choice. Make sure the pharmacy is open so you can pick up prescription now. If there is a problem, you may contact your provider through MyChart messaging and have the prescription routed to another pharmacy.  Your safety is important to us. If you have drug allergies check your prescription carefully.   For the next 24 hours you can use MyChart to ask questions about today's visit, request a non-urgent call back, or ask for a work or school excuse. You will get   an email in the next two days asking about your experience. I hope that your e-visit has been valuable and will speed your recovery.  I have spent 5 minutes in review of e-visit questionnaire, review and updating patient chart, medical decision making and response to patient.   Michaeline Eckersley M Sharone Picchi, PA-C  

## 2022-10-12 ENCOUNTER — Ambulatory Visit (HOSPITAL_BASED_OUTPATIENT_CLINIC_OR_DEPARTMENT_OTHER)
Admission: RE | Admit: 2022-10-12 | Discharge: 2022-10-12 | Disposition: A | Discharge status: Home / Self Care | Payer: BC Managed Care – PPO | Source: Ambulatory Visit | Attending: Cardiovascular Disease | Admitting: Cardiovascular Disease

## 2022-10-12 DIAGNOSIS — R0609 Other forms of dyspnea: Secondary | ICD-10-CM

## 2022-10-12 DIAGNOSIS — I5042 Chronic combined systolic (congestive) and diastolic (congestive) heart failure: Secondary | ICD-10-CM | POA: Diagnosis not present

## 2022-10-12 LAB — ECHOCARDIOGRAM COMPLETE
AR max vel: 2.22 cm2
AV Area VTI: 2.32 cm2
AV Area mean vel: 2.14 cm2
AV Mean grad: 3 mmHg
AV Peak grad: 5.4 mmHg
Ao pk vel: 1.16 m/s
Area-P 1/2: 3.54 cm2
Calc EF: 32.1 %
MV M vel: 4.76 m/s
MV Peak grad: 90.6 mmHg
MV VTI: 2.03 cm2
Radius: 0.5 cm
S' Lateral: 5.95 cm
Single Plane A2C EF: 32.2 %
Single Plane A4C EF: 27.9 %

## 2022-10-12 MED ORDER — PERFLUTREN LIPID MICROSPHERE
1.0000 mL | INTRAVENOUS | Status: AC | PRN
  Administered 2022-10-12: 3 mL via INTRAVENOUS

## 2022-10-16 ENCOUNTER — Other Ambulatory Visit: Payer: Self-pay

## 2022-10-16 ENCOUNTER — Ambulatory Visit: Payer: BC Managed Care – PPO | Attending: Nurse Practitioner | Admitting: Nurse Practitioner

## 2022-10-16 ENCOUNTER — Telehealth: Payer: Self-pay

## 2022-10-16 ENCOUNTER — Encounter: Payer: Self-pay | Admitting: Nurse Practitioner

## 2022-10-16 VITALS — BP 114/70 | HR 77 | Ht 65.0 in | Wt 270.0 lb

## 2022-10-16 DIAGNOSIS — I429 Cardiomyopathy, unspecified: Secondary | ICD-10-CM | POA: Diagnosis not present

## 2022-10-16 DIAGNOSIS — I5042 Chronic combined systolic (congestive) and diastolic (congestive) heart failure: Secondary | ICD-10-CM

## 2022-10-16 MED ORDER — ENTRESTO 97-103 MG PO TABS: 1.0000 | ORAL_TABLET | Freq: Two times a day (BID) | ORAL | 3 refills | Status: DC

## 2022-10-16 NOTE — Progress Notes (Addendum)
Office Visit    Patient Name: Alicia Christensen Date of Encounter: 10/16/2022  Primary Care Provider:  Pcp, No Primary Cardiologist:  Thurmon Fair, MD  Chief Complaint    37 year old female with a history of cardiomyopathy, chronic combined systolic and diastolic heart failure, preeclampsia, and obesity who presents for follow-up related to heart failure.  Past Medical History    Past Medical History:  Diagnosis Date   Environmental allergies    Frequent headaches    Gestational diabetes    Hx of varicella    Migraine    Seasonal allergies    Vaginal Pap smear, abnormal    Past Surgical History:  Procedure Laterality Date   CESAREAN SECTION N/A 01/15/2016   Procedure: CESAREAN SECTION;  Surgeon: Olivia Mackie, MD;  Location: [facility];  Service: Obstetrics;  Laterality: N/A;   CESAREAN SECTION N/A 04/19/2021   Procedure: Repeat CESAREAN SECTION;  Surgeon: Olivia Mackie, MD;  Location: [facility];  Service: Obstetrics;  Laterality: N/A;  EDD: 04/29/21 Ok per Misty Stanley.   DILATION AND CURETTAGE OF UTERUS     WISDOM TOOTH EXTRACTION      Allergies  Allergies  Allergen Reactions   Amoxicillin Nausea And Vomiting   Hydrocodeine [Dihydrocodeine] Nausea And Vomiting   Penicillins Nausea And Vomiting    Has patient had a PCN reaction causing immediate rash, facial/tongue/throat swelling, SOB or lightheadedness with hypotension: No Has patient had a PCN reaction causing severe rash involving mucus membranes or skin necrosis: No Has patient had a PCN reaction that required hospitalization No Has patient had a PCN reaction occurring within the last 10 years: No If all of the above answers are "NO", then may proceed with Cephalosporin use.     History of Present Illness    37 year old female with the above past medical history  including cardiomyopathy, chronic combined systolic and diastolic heart failure, preeclampsia, and obesity.  She delivered her second child in July 2022.   She was noted to have preeclampsia that required hospitalization.  In spring 2023 she developed exertional dyspnea and persistent cough.  CT of the chest showed no evidence of PE or coronary or aortic atherosclerosis, but did show evidence of congestive heart failure.  Follow-up echocardiogram showed severely depressed LV function, EF 20%, dilated left ventricle, dilated left atrium, moderate to severe mitral valve regurgitation, moderate to severe tricuspid valve regurgitation, diastolic dysfunction with elevation of left atrial filling pressures and a restrictive pattern of filling.  Cardiac MRI was recommended but not performed.  She denies significant history ischemic cardiomyopathy.  She was last seen in the office on 08/24/2022 and was stable from a cardiac standpoint.  She was started on spironolactone, carvedilol was increased.  Repeat echocardiogram in 09/2022 showed EF 25 to 30%, severely decreased LV function, no RWMA, moderately dilated LV internal cavity, mild LVH, G2 DD, normal RV systolic function, moderate mitral valve regurgitation.  Escalation of Entresto therapy was recommended.  She presents today for follow-up. Since her last visit she has been stable from a cardiac standpoint.  He denies any chest pain, dyspnea, edema, PND, orthopnea, weight gain.  She is tolerating current dose of Entresto.  Overall, she reports feeling well.  Home Medications    Current Outpatient Medications  Medication Sig Dispense Refill   acetaminophen (TYLENOL) 325 MG tablet Take 2 tablets (650 mg total) by mouth every 6 (six) hours as needed for headache, fever, moderate pain or mild pain.     carvedilol (COREG) 12.5 MG  tablet Take 1 tablet (12.5 mg total) by mouth 2 (two) times daily with a meal. 180 tablet 3   cetirizine (ZYRTEC) 10 MG chewable tablet Chew 10 mg by mouth daily.     empagliflozin (JARDIANCE) 10 MG TABS tablet Take 1 tablet (10 mg total) by mouth daily before breakfast. 90 tablet 1   Ferrous  Sulfate (IRON PO) Take 1 tablet by mouth every other day.     fluticasone (FLONASE) 50 MCG/ACT nasal spray Place 1 spray into both nostrils daily as needed for allergies.     furosemide (LASIX) 20 MG tablet Take 20 mg by mouth 2 (two) times daily.     iron polysaccharides (NIFEREX) 150 MG capsule Take 1 capsule (150 mg total) by mouth daily.     sacubitril-valsartan (ENTRESTO) 97-103 MG Take 1 tablet by mouth 2 (two) times daily. 180 tablet 3   spironolactone (ALDACTONE) 25 MG tablet Take 0.5 tablets (12.5 mg total) by mouth daily. 45 tablet 0   hydrOXYzine (VISTARIL) 25 MG capsule Take 1 capsule (25 mg total) by mouth every 8 (eight) hours as needed. (Patient not taking: Reported on 10/16/2022) 30 capsule 0   No current facility-administered medications for this visit.     Review of Systems   She denies chest pain, palpitations, dyspnea, pnd, orthopnea, n, v, dizziness, syncope, edema, weight gain, or early satiety. All other systems reviewed and are otherwise negative except as noted above.   Physical Exam    VS:  BP 114/70 (BP Location: Left Arm, Patient Position: Sitting, Cuff Size: Large)   Pulse 77   Ht 5\' 5"  (1.651 m)   Wt 270 lb (122.5 kg)   SpO2 98%   BMI 44.93 kg/m  GEN: Well nourished, well developed, in no acute distress. HEENT: normal. Neck: Supple, no JVD, carotid bruits, or masses. Cardiac: RRR, no murmurs, rubs, or gallops. No clubbing, cyanosis, edema.  Radials/DP/PT 2+ and equal bilaterally.  Respiratory:  Respirations regular and unlabored, clear to auscultation bilaterally. GI: Soft, nontender, nondistended, BS + x 4. MS: no deformity or atrophy. Skin: warm and dry, no rash. Neuro:  Strength and sensation are intact. Psych: Normal affect.  Accessory Clinical Findings    ECG personally reviewed by me today - No EKG in office.    Lab Results  Component Value Date   WBC 8.7 04/25/2021   HGB 10.4 (L) 04/25/2021   HCT 33.1 (L) 04/25/2021   MCV 81.5 04/25/2021    PLT 307 04/25/2021   Lab Results  Component Value Date   CREATININE 0.61 08/06/2022   BUN 9 08/06/2022   NA 139 08/06/2022   K 4.0 08/06/2022   CL 100 08/06/2022   CO2 22 08/06/2022   Lab Results  Component Value Date   ALT 21 04/25/2021   AST 21 04/25/2021   ALKPHOS 85 04/25/2021   BILITOT 0.6 04/25/2021   No results found for: "CHOL", "HDL", "LDLCALC", "LDLDIRECT", "TRIG", "CHOLHDL"  No results found for: "HGBA1C"  Assessment & Plan    1. Chronic combined systolic and diastolic heart failure: Echo in 09/2022 showed EF 25 to 30%, severely decreased LV function, no RWMA, moderately dilated LV internal cavity, mild LVH, G2 DD, normal RV systolic function, moderate mitral valve regurgitation.  Discussed results with patient. Euvolemic and well compensated on exam. Will increase Entresto to 97-103 mg bid.  Will check BMET today and in 2 weeks. Continue carvedilol, Jardiance, spironolactone, and Lasix.  2. Cardiomyopathy: Etiology unclear. Low suspicion for CAD.  Per prior notes she does have family history of NICM with severely depressed LVEF (question inheritable cardiomyopathy).  Additionally she had preeclampsia during her second pregnancy,  (consider delayed presentation of postpartum cardiomyopathy).  Most recent echo showed only mild improvement in EF.  Will increase Entresto as above.  Will discuss with Dr. Sallyanne Kuster, primary cardiologist, possible cardiac MRI vs repeat echo in the future.  Otherwise, continue current medications as above.  ADDENDUM 10/16/2022: Per Dr. Sallyanne Kuster, primary cardiologist, will go ahead and schedule patient for cardiac MRI.  Patient notified via telephone.  3. Obesity: Encouraged ongoing lifestyle modifications with diet and exercise.   4. Disposition: Follow-up  in 3 months.      Lenna Sciara, NP 10/16/2022, 12:49 PM

## 2022-10-16 NOTE — Progress Notes (Signed)
Noted. Order has been placed.  

## 2022-10-16 NOTE — Progress Notes (Signed)
Please go ahead and order an MRI in about 3 months (it will take that long to get it scheduled)

## 2022-10-16 NOTE — Telephone Encounter (Signed)
Alicia Browner NP left message for Alicia Christensen that was seen in office today 10/16/2022. Alicia Christensen needs a Cardiac MRI per Dr. Timmie Foerster. Order was placed for Cardiac MRI & CBC needed prior to MRI. Please arrange cardiac MRI.

## 2022-10-16 NOTE — Addendum Note (Signed)
Addended by: Derrick Ravel on: 04/23/239 97:35 PM   Modules accepted: Orders

## 2022-10-16 NOTE — Patient Instructions (Addendum)
Medication Instructions:  Increase Entresto 97-103 mg twice daily  *If you need a refill on your cardiac medications before your next appointment, please call your pharmacy*   Lab Work: Your physician recommends that you complete lab work today and return for lab work in 2 weeks. BMET  If you have labs (blood work) drawn today and your tests are completely normal, you will receive your results only by: [place] (if you have MyChart) OR A paper copy in the mail If you have any lab test that is abnormal or we need to change your treatment, we will call you to review the results.   Testing/Procedures: Your physician has requested that you have a cardiac MRI. Cardiac MRI uses a computer to create images of your heart as its beating, producing both still and moving pictures of your heart and major blood vessels. For further information please visit [email or web address] Please follow the instruction sheet given to you today for more information.    Follow-Up: At [facility], you and your health needs are our priority.  As part of our continuing mission to provide you with exceptional heart care, we have created designated Provider Care Teams.  These Care Teams include your primary Cardiologist (physician) and Advanced Practice Providers (APPs -  Physician Assistants and Nurse Practitioners) who all work together to provide you with the care you need, when you need it.  We recommend signing up for the patient portal called "MyChart".  Sign up information is provided on this After Visit Summary.  MyChart is used to connect with patients for Virtual Visits (Telemedicine).  Patients are able to view lab/test results, encounter notes, upcoming appointments, etc.  Non-urgent messages can be sent to your provider as well.   To learn more about what you can do with MyChart, go to [email or web address].    Your next appointment:   3 month(s)  The format for your next appointment:    In Person  Provider:   Diona Browner, NP        Other Instructions   Important Information About Sugar

## 2022-10-17 ENCOUNTER — Telehealth: Payer: Self-pay

## 2022-10-17 LAB — BASIC METABOLIC PANEL
BUN/Creatinine Ratio: 14 (ref 9–23)
BUN: 12 mg/dL (ref 6–20)
CO2: 23 mmol/L (ref 20–29)
Calcium: 9.5 mg/dL (ref 8.7–10.2)
Chloride: 103 mmol/L (ref 96–106)
Creatinine, Ser: 0.83 mg/dL (ref 0.57–1.00)
Glucose: 126 mg/dL — ABNORMAL HIGH (ref 70–99)
Potassium: 5 mmol/L (ref 3.5–5.2)
Sodium: 143 mmol/L (ref 134–144)
eGFR: 95 mL/min/{1.73_m2} (ref >59)

## 2022-10-17 NOTE — Telephone Encounter (Signed)
Left a detailed message for pt. Pt was notified of lab results and will repeat labs in 2 weeks.

## 2022-11-12 ENCOUNTER — Other Ambulatory Visit: Payer: Self-pay | Admitting: Cardiovascular Disease

## 2022-11-13 LAB — BASIC METABOLIC PANEL
BUN/Creatinine Ratio: 12 (ref 9–23)
BUN: 9 mg/dL (ref 6–20)
CO2: 24 mmol/L (ref 20–29)
Calcium: 9 mg/dL (ref 8.7–10.2)
Chloride: 102 mmol/L (ref 96–106)
Creatinine, Ser: 0.73 mg/dL (ref 0.57–1.00)
Glucose: 126 mg/dL — ABNORMAL HIGH (ref 70–99)
Potassium: 3.9 mmol/L (ref 3.5–5.2)
Sodium: 139 mmol/L (ref 134–144)
eGFR: 111 mL/min/{1.73_m2} (ref >59)

## 2022-11-13 LAB — CBC
Hematocrit: 39.1 % (ref 34.0–46.6)
Hemoglobin: 12.6 g/dL (ref 11.1–15.9)
MCH: 27.7 pg (ref 26.6–33.0)
MCHC: 32.2 g/dL (ref 31.5–35.7)
MCV: 86 fL (ref 79–97)
Platelets: 264 10*3/uL (ref 150–450)
RBC: 4.55 x10E6/uL (ref 3.77–5.28)
RDW: 15.5 % — ABNORMAL HIGH (ref 11.7–15.4)
WBC: 8.5 10*3/uL (ref 3.4–10.8)

## 2022-11-15 ENCOUNTER — Telehealth: Payer: Self-pay

## 2022-11-15 NOTE — Telephone Encounter (Signed)
Left a detailed message on pts machine. Pt was notified of lab results and advised to continue her current medication. Pt can call back with any questions or concerns.  

## 2023-01-13 ENCOUNTER — Telehealth: Payer: BC Managed Care – PPO | Admitting: Nurse Practitioner

## 2023-01-13 DIAGNOSIS — B9689 Other specified bacterial agents as the cause of diseases classified elsewhere: Secondary | ICD-10-CM | POA: Diagnosis not present

## 2023-01-13 DIAGNOSIS — J208 Acute bronchitis due to other specified organisms: Secondary | ICD-10-CM | POA: Diagnosis not present

## 2023-01-13 MED ORDER — AZITHROMYCIN 250 MG PO TABS: ORAL_TABLET | ORAL | 0 refills | Status: AC

## 2023-01-13 MED ORDER — ALBUTEROL SULFATE HFA 108 (90 BASE) MCG/ACT IN AERS: 2.0000 | INHALATION_SPRAY | Freq: Four times a day (QID) | RESPIRATORY_TRACT | 0 refills | Status: AC | PRN

## 2023-01-13 NOTE — Progress Notes (Signed)
I have sent a zpak and an inhaler however it appears you have been treated 3 times since November for possible bronchitis. I would recommend you have a chest xray or follow up with PCP to ensure you do not have asthma. Your symptoms may not be related to bronchitis and we should not keep treating as such if there is another etiology causing your symptoms to occur every few months.     E-Visit for Cough   We are sorry that you are not feeling well.  Here is how we plan to help!   Based on your presentation I believe you most likely have A cough due to bacteria.  When patients have a fever and a productive cough with a change in color or increased sputum production, we are concerned about bacterial bronchitis.  If left untreated it can progress to pneumonia.  If your symptoms do not improve with your treatment plan it is important that you contact your provider.   I have prescribed Azithromyin 250 mg: two tablets now and then one tablet daily for 4 additonal days        From your responses in the eVisit questionnaire you describe inflammation in the upper respiratory tract which is causing a significant cough.  This is commonly called Bronchitis and has four common causes:   Allergies Viral Infections Acid Reflux Bacterial Infection Allergies, viruses and acid reflux are treated by controlling symptoms or eliminating the cause. An example might be a cough caused by taking certain blood pressure medications. You stop the cough by changing the medication. Another example might be a cough caused by acid reflux. Controlling the reflux helps control the cough.   USE OF BRONCHODILATOR ("RESCUE") INHALERS: There is a risk from using your bronchodilator too frequently.  The risk is that over-reliance on a medication which only relaxes the muscles surrounding the breathing tubes can reduce the effectiveness of medications prescribed to reduce swelling and congestion of the tubes themselves.  Although you  feel brief relief from the bronchodilator inhaler, your asthma may actually be worsening with the tubes becoming more swollen and filled with mucus.  This can delay other crucial treatments, such as oral steroid medications. If you need to use a bronchodilator inhaler daily, several times per day, you should discuss this with your provider.  There are probably better treatments that could be used to keep your asthma under control.                HOME CARE Only take medications as instructed by your medical team. Complete the entire course of an antibiotic. Drink plenty of fluids and get plenty of rest. Avoid close contacts especially the very young and the elderly Cover your mouth if you cough or cough into your sleeve. Always remember to wash your hands A steam or ultrasonic humidifier can help congestion.    GET HELP RIGHT AWAY IF: You develop worsening fever. You become short of breath You cough up blood. Your symptoms persist after you have completed your treatment plan MAKE SURE YOU  Understand these instructions. Will watch your condition. Will get help right away if you are not doing well or get worse.     Thank you for choosing an e-visit.   Your e-visit answers were reviewed by a board certified advanced clinical practitioner to complete your personal care plan. Depending upon the condition, your plan could have included both over the counter or prescription medications.   Please review your pharmacy choice. Make  sure the pharmacy is open so you can pick up prescription now. If there is a problem, you may contact your provider through [employer] and have the prescription routed to another pharmacy.  Your safety is important to [place]. If you have drug allergies check your prescription carefully.    For the next 24 hours you can use MyChart to ask questions about today's visit, request a non-urgent call back, or ask for a work or school excuse. You will get an email in the  next two days asking about your experience. I hope that your e-visit has been valuable and will speed your recovery.

## 2023-01-13 NOTE — Progress Notes (Signed)
I have spent 5 minutes in review of e-visit questionnaire, review and updating patient chart, medical decision making and response to patient.  ° °Qiara Minetti W Thersea Manfredonia, NP ° °  °

## 2023-01-16 ENCOUNTER — Telehealth (HOSPITAL_COMMUNITY): Payer: Self-pay | Admitting: Emergency Medicine

## 2023-01-16 NOTE — Telephone Encounter (Signed)
Reaching out to patient to offer assistance regarding upcoming cardiac imaging study; pt verbalizes understanding of appt date/time, parking situation and where to check in, pre-test NPO status and medications ordered, and verified current allergies; name and call back number provided for further questions should they arise Alicia Bond RN Navigator Cardiac Imaging [facility] [phone] office [phone] cell  Arrival [facility] radiology Denies Iv issues Denies claustro Denies metal implants

## 2023-01-17 ENCOUNTER — Other Ambulatory Visit: Payer: Self-pay | Admitting: Nurse Practitioner

## 2023-01-17 ENCOUNTER — Ambulatory Visit (HOSPITAL_COMMUNITY)
Admission: RE | Admit: 2023-01-17 | Discharge: 2023-01-17 | Disposition: A | Discharge status: Home / Self Care | Payer: BC Managed Care – PPO | Source: Ambulatory Visit | Attending: Nurse Practitioner | Admitting: Nurse Practitioner

## 2023-01-17 DIAGNOSIS — I429 Cardiomyopathy, unspecified: Secondary | ICD-10-CM | POA: Diagnosis present

## 2023-01-17 MED ORDER — GADOBUTROL 1 MMOL/ML IV SOLN
10.0000 mL | Freq: Once | INTRAVENOUS | Status: AC | PRN
  Administered 2023-01-17: 10 mL via INTRAVENOUS

## 2023-01-18 ENCOUNTER — Other Ambulatory Visit: Payer: Self-pay | Admitting: Cardiovascular Disease

## 2023-01-24 ENCOUNTER — Encounter: Payer: Self-pay | Admitting: Nurse Practitioner

## 2023-01-24 ENCOUNTER — Ambulatory Visit: Payer: BC Managed Care – PPO | Attending: Nurse Practitioner | Admitting: Nurse Practitioner

## 2023-01-24 VITALS — BP 118/68 | HR 80 | Ht 64.0 in | Wt 282.6 lb

## 2023-01-24 DIAGNOSIS — I429 Cardiomyopathy, unspecified: Secondary | ICD-10-CM

## 2023-01-24 DIAGNOSIS — I5042 Chronic combined systolic (congestive) and diastolic (congestive) heart failure: Secondary | ICD-10-CM

## 2023-01-24 DIAGNOSIS — R002 Palpitations: Secondary | ICD-10-CM | POA: Diagnosis not present

## 2023-01-24 NOTE — Patient Instructions (Signed)
Medication Instructions:  No Changes *If you need a refill on your cardiac medications before your next appointment, please call your pharmacy*  Lab Work: None Ordered   Testing/Procedures: None Ordered   Follow-Up: At [facility], you and your health needs are our priority.  As part of our continuing mission to provide you with exceptional heart care, we have created designated Provider Care Teams.  These Care Teams include your primary Cardiologist (physician) and Advanced Practice Providers (APPs -  Physician Assistants and Nurse Practitioners) who all work together to provide you with the care you need, when you need it.  We recommend signing up for the patient portal called "MyChart".  Sign up information is provided on this After Visit Summary.  MyChart is used to connect with patients for Virtual Visits (Telemedicine).  Patients are able to view lab/test results, encounter notes, upcoming appointments, etc.  Non-urgent messages can be sent to your provider as well.   To learn more about what you can do with MyChart, go to [email or web address].    Your next appointment:   6 month(s)  Provider:   Mihai Croitoru, MD     

## 2023-01-24 NOTE — Progress Notes (Signed)
Office Visit    Patient Name: Alicia Christensen Date of Encounter: 01/24/2023  Primary Care Provider:  Pcp, No Primary Cardiologist:  Thurmon Fair, MD  Chief Complaint    37 year old female with a history of cardiomyopathy, chronic combined systolic and diastolic heart failure, preeclampsia, and obesity who presents for follow-up related to heart failure.  Past Medical History    Past Medical History:  Diagnosis Date   Environmental allergies    Frequent headaches    Gestational diabetes    Hx of varicella    Migraine    Seasonal allergies    Vaginal Pap smear, abnormal    Past Surgical History:  Procedure Laterality Date   CESAREAN SECTION N/A 01/15/2016   Procedure: CESAREAN SECTION;  Surgeon: Olivia Mackie, MD;  Location: [facility];  Service: Obstetrics;  Laterality: N/A;   CESAREAN SECTION N/A 04/19/2021   Procedure: Repeat CESAREAN SECTION;  Surgeon: Olivia Mackie, MD;  Location: [facility];  Service: Obstetrics;  Laterality: N/A;  EDD: 04/29/21 Ok per Misty Stanley.   DILATION AND CURETTAGE OF UTERUS     WISDOM TOOTH EXTRACTION      Allergies  Allergies  Allergen Reactions   Amoxicillin Nausea And Vomiting   Hydrocodeine [Dihydrocodeine] Nausea And Vomiting   Penicillins Nausea And Vomiting    Has patient had a PCN reaction causing immediate rash, facial/tongue/throat swelling, SOB or lightheadedness with hypotension: No Has patient had a PCN reaction causing severe rash involving mucus membranes or skin necrosis: No Has patient had a PCN reaction that required hospitalization No Has patient had a PCN reaction occurring within the last 10 years: No If all of the above answers are "NO", then may proceed with Cephalosporin use.      Labs/Other Studies Reviewed    The following studies were reviewed today: Echo 09/2022: IMPRESSIONS     1. Left ventricular ejection fraction, by estimation, is 25 to 30%. Left  ventricular ejection fraction by 3D volume is 35 %. The  left ventricle has  severely decreased function. The left ventricle has no regional wall  motion abnormalities. The left  ventricular internal cavity size was moderately dilated. There is mild  left ventricular hypertrophy. Left ventricular diastolic parameters are  consistent with Grade II diastolic dysfunction (pseudonormalization).   2. Right ventricular systolic function is normal. The right ventricular  size is normal.   3. Left atrial size was moderately dilated.   4. The mitral valve is normal in structure. Moderate mitral valve  regurgitation. No evidence of mitral stenosis.   5. The aortic valve is normal in structure. Aortic valve regurgitation is  not visualized. No aortic stenosis is present.   6. The inferior vena cava is normal in size with greater than 50%  respiratory variability, suggesting right atrial pressure of 3 mmHg.   Comparison(s): 03/27/22 echocardiogram showed EF 20-25%, mod-sev MR,  mod-sev TR [facility]).   cMRI 01/2023:  IMPRESSION: 1.  Moderately dilatedl LV with EF 35%, diffuse hypokinesis.   2.  Normal RV size with mild systolic dysfunction, EF 44%.   3. No myocardial LGE, so no definitive evidence for prior MI, infiltrative disease, or myocarditis.   4. Extracellular volume percentage 30%, mildly (nonspecifically) elevated.   Dalton Mclean Recent Labs: 08/06/2022: NT-Pro BNP 440 11/12/2022: BUN 9; Creatinine, Ser 0.73; Hemoglobin 12.6; Platelets 264; Potassium 3.9; Sodium 139  Recent Lipid Panel No results found for: "CHOL", "TRIG", "HDL", "CHOLHDL", "VLDL", "LDLCALC", "LDLDIRECT"  History of Present Illness  37 year old female with the above past medical history  including cardiomyopathy, chronic combined systolic and diastolic heart failure, preeclampsia, and obesity.   She delivered her second child in July 2022.  She was noted to have preeclampsia that required hospitalization.  In spring 2023 she developed exertional dyspnea and  persistent cough.  CT of the chest showed no evidence of PE or coronary or aortic atherosclerosis, but did show evidence of congestive heart failure.  Follow-up echocardiogram showed severely depressed LV function, EF 20%, dilated left ventricle, dilated left atrium, moderate to severe mitral valve regurgitation, moderate to severe tricuspid valve regurgitation, diastolic dysfunction with elevation of left atrial filling pressures and a restrictive pattern of filling.  Follow-up cardiac MRI was recommended. She was started on spironolactone, carvedilol was increased.  Repeat echocardiogram in 09/2022 showed EF 25 to 30%, severely decreased LV function, no RWMA, moderately dilated LV internal cavity, mild LVH, G2 DD, normal RV systolic function, moderate mitral valve regurgitation.  She was last seen in the office on 10/16/2022 and was stable from a cardiac standpoint. Entresto was increased.  Cardiac MRI in 4/24 with EF 35%, diffuse hypokinesis, normal RV size with systolic dysfunction, EF 44%, no myocardial LGE, no definitive evidence of prior MI, infiltrative disease, or myocarditis extracellular volume percentage 30%, mildly elevated.   She presents today for follow-up. Since her last visit she has been stable from a cardiac standpoint.  She was recently treated for an upper respiratory infection and was given a steroid taper.  Earlier today she had an episode of elevated heart rate, chest tightness.  Her symptoms resolved spontaneously after few minutes.  She denies any additional palpitations, denies dizziness, presyncope, syncope, chest pain, dyspnea, edema, PND, orthopnea, weight gain.  Overall, she reports feeling well.    Home Medications    Current Outpatient Medications  Medication Sig Dispense Refill   acetaminophen (TYLENOL) 325 MG tablet Take 2 tablets (650 mg total) by mouth every 6 (six) hours as needed for headache, fever, moderate pain or mild pain.     albuterol (VENTOLIN HFA) 108 (90 Base)  MCG/ACT inhaler Inhale 2 puffs into the lungs every 6 (six) hours as needed for wheezing or shortness of breath. 8 g 0   carvedilol (COREG) 12.5 MG tablet Take 1 tablet (12.5 mg total) by mouth 2 (two) times daily with a meal. 180 tablet 3   cetirizine (ZYRTEC) 10 MG chewable tablet Chew 10 mg by mouth daily.     empagliflozin (JARDIANCE) 10 MG TABS tablet Take 1 tablet (10 mg total) by mouth daily before breakfast. 90 tablet 3   Ferrous Sulfate (IRON PO) Take 1 tablet by mouth every other day.     fluticasone (FLONASE) 50 MCG/ACT nasal spray Place 1 spray into both nostrils daily as needed for allergies.     furosemide (LASIX) 20 MG tablet Take 20 mg by mouth 2 (two) times daily.     hydrOXYzine (VISTARIL) 25 MG capsule Take 1 capsule (25 mg total) by mouth every 8 (eight) hours as needed. 30 capsule 0   iron polysaccharides (NIFEREX) 150 MG capsule Take 1 capsule (150 mg total) by mouth daily.     sacubitril-valsartan (ENTRESTO) 97-103 MG Take 1 tablet by mouth 2 (two) times daily. 180 tablet 3   spironolactone (ALDACTONE) 25 MG tablet Take 1/2 tablet (12.5 mg total) by mouth daily. 45 tablet 3   No current facility-administered medications for this visit.     Review of Systems    She  denies chest pain, dyspnea, pnd, orthopnea, n, v, dizziness, syncope, edema, weight gain, or early satiety. All other systems reviewed and are otherwise negative except as noted above.   Physical Exam    VS:  BP 118/68 (BP Location: Left Arm, Patient Position: Sitting, Cuff Size: Large)   Pulse 80   Ht 5\' 4"  (1.626 m)   Wt 282 lb 9.6 oz (128.2 kg)   SpO2 96%   BMI 48.51 kg/m   GEN: Well nourished, well developed, in no acute distress. HEENT: normal. Neck: Supple, no JVD, carotid bruits, or masses. Cardiac: RRR, no murmurs, rubs, or gallops. No clubbing, cyanosis, edema.  Radials/DP/PT 2+ and equal bilaterally.  Respiratory:  Respirations regular and unlabored, clear to auscultation bilaterally. GI:  Soft, nontender, nondistended, BS + x 4. MS: no deformity or atrophy. Skin: warm and dry, no rash. Neuro:  Strength and sensation are intact. Psych: Normal affect.  Accessory Clinical Findings    ECG personally reviewed by me today - No EKG in office today.     Lab Results  Component Value Date   WBC 8.5 11/12/2022   HGB 12.6 11/12/2022   HCT 39.1 11/12/2022   MCV 86 11/12/2022   PLT 264 11/12/2022   Lab Results  Component Value Date   CREATININE 0.73 11/12/2022   BUN 9 11/12/2022   NA 139 11/12/2022   K 3.9 11/12/2022   CL 102 11/12/2022   CO2 24 11/12/2022   Lab Results  Component Value Date   ALT 21 04/25/2021   AST 21 04/25/2021   ALKPHOS 85 04/25/2021   BILITOT 0.6 04/25/2021   No results found for: "CHOL", "HDL", "LDLCALC", "LDLDIRECT", "TRIG", "CHOLHDL"  No results found for: "HGBA1C"  Assessment & Plan   1. Chronic combined systolic and diastolic heart failure: Echo in 09/2022 showed EF 25 to 30%, severely decreased LV function, no RWMA, moderately dilated LV internal cavity, mild LVH, G2 DD, normal RV systolic function, moderate mitral valve regurgitation. Cardiac MRI in 01/2023 showed EF 35%, diffuse hypokinesis, normal RV size with systolic dysfunction, EF 44%, no myocardial LGE, no definitive evidence of prior MI, infiltrative disease, or myocarditis extracellular volume percentage 30%, mildly elevated. Euvolemic and well compensated on exam.  I do not think her blood pressure while out for titration of spironolactone at this time.  Continue carvedilol, Entresto, Jardiance, spironolactone, and Lasix.   2. 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  Per prior notes she does have family history of NICM with severely depressed LVEF (question inheritable cardiomyopathy).  Additionally she had preeclampsia during her second pregnancy,  (consider delayed presentation of postpartum cardiomyopathy).  Does not meet criteria for ICD at this time. Continue  current medications as above.    3.  Palpitations: Earlier today she had an episode of elevated heart rate, with associated chest tightness.  Her symptoms resolved spontaneously after a few minutes.  She denies any additional palpitations, denies any other symptoms concerning for angina.  We discussed possible cardiac monitor, however, patient prefers to continue to monitor for any recurrent symptoms.  Discussed ED precautions.    4. Obesity: Encouraged ongoing lifestyle modifications with diet and exercise.  She does not have a PCP.  Recommendations given today.   4. Disposition: Follow-up in 6 months, sooner if needed.      Joylene Grapes, NP 01/24/2023, 3:16 PM

## 2023-02-13 ENCOUNTER — Encounter: Payer: Self-pay | Admitting: Cardiovascular Disease

## 2023-02-14 NOTE — Telephone Encounter (Signed)
It was probably wegovy. We can ask our pharmacy team to see if her insurance will cover wegovy or equivalent GLP-1 agonist. I can prescribe if that is an option.

## 2023-06-10 ENCOUNTER — Other Ambulatory Visit: Payer: Self-pay | Admitting: Cardiovascular Disease

## 2023-07-25 LAB — HM PAP SMEAR: HM Pap smear: NORMAL

## 2023-10-01 ENCOUNTER — Ambulatory Visit: Payer: BC Managed Care – PPO | Attending: Cardiovascular Disease | Admitting: Cardiovascular Disease

## 2023-10-01 ENCOUNTER — Encounter: Payer: Self-pay | Admitting: Cardiovascular Disease

## 2023-10-01 VITALS — BP 118/76 | HR 82 | Ht 64.5 in | Wt 278.0 lb

## 2023-10-01 DIAGNOSIS — I5042 Chronic combined systolic (congestive) and diastolic (congestive) heart failure: Secondary | ICD-10-CM

## 2023-10-01 DIAGNOSIS — I428 Other cardiomyopathies: Secondary | ICD-10-CM

## 2023-10-01 MED ORDER — FUROSEMIDE 20 MG PO TABS: 20.0000 mg | ORAL_TABLET | Freq: Every day | ORAL | 3 refills | Status: DC

## 2023-10-01 MED ORDER — CARVEDILOL 25 MG PO TABS: 25.0000 mg | ORAL_TABLET | Freq: Two times a day (BID) | ORAL | 3 refills | Status: DC

## 2023-10-01 NOTE — Patient Instructions (Signed)
Medication Instructions:  Carvedilol 25 mg twice daily Furosemide 20 mg daily *If you need a refill on your cardiac medications before your next appointment, please call your pharmacy*  Testing/Procedures: Your physician has requested that you have an echocardiogram. Echocardiography is a painless test that uses sound waves to create images of your heart. It provides your doctor with information about the size and shape of your heart and how well your heart's chambers and valves are working. This procedure takes approximately one hour. There are no restrictions for this procedure. Please do NOT wear cologne, perfume, aftershave, or lotions (deodorant is allowed). Please arrive 15 minutes prior to your appointment time.  Please note: We ask at that you not bring children with you during ultrasound (echo/ vascular) testing. Due to room size and safety concerns, children are not allowed in the ultrasound rooms during exams. Our front office staff cannot provide observation of children in our lobby area while testing is being conducted. An adult accompanying a patient to their appointment will only be allowed in the ultrasound room at the discretion of the ultrasound technician under special circumstances. We apologize for any inconvenience.    Follow-Up: At [facility], you and your health needs are our priority.  As part of our continuing mission to provide you with exceptional heart care, we have created designated Provider Care Teams.  These Care Teams include your primary Cardiologist (physician) and Advanced Practice Providers (APPs -  Physician Assistants and Nurse Practitioners) who all work together to provide you with the care you need, when you need it.  We recommend signing up for the patient portal called "MyChart".  Sign up information is provided on this After Visit Summary.  MyChart is used to connect with patients for Virtual Visits (Telemedicine).  Patients are able to view  lab/test results, encounter notes, upcoming appointments, etc.  Non-urgent messages can be sent to your provider as well.   To learn more about what you can do with MyChart, go to [email or web address].    Your next appointment:   1 year(s)  Provider:   Thurmon Fair, MD

## 2023-10-01 NOTE — Progress Notes (Signed)
Cardiology Office Note:    Date:  10/04/2023   ID:  Alicia Christensen, DOB Jan 24, 1986, MRN [id number]  PCP:  Aviva Kluver   [facility] Providers Cardiologist:  Thurmon Fair, MD     Referring MD: No ref. provider found   Chief Complaint  Patient presents with   Congestive Heart Failure          History of Present Illness:    Alicia Christensen is a 37 y.o. female with a hx of recently diagnosed congestive heart failure due to severe cardiomyopathy (left ventricular systolic function around 20%).   She is doing well from a symptom point of view, although she appears to be upset that she did not have to take a lot of medications for the rest of the life.  She has tolerated increasing the dose of Entresto to maximum and is also taking fairly high dose of carvedilol as well as Jardiance and spironolactone.  Her blood pressure is in normal range.  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  Seems to have NYHA functional class I.  She does not have any lower extremity edema.  She has not had dizziness or syncope or palpitations.  She is still taking furosemide 40 mg twice daily, but occasionally skips a dose and this does not appear to lead to any worsening symptoms or edema.  [name] delivered her second child, a baby boy in July 2022.  She had been fairly sick during the pregnancy and had only gained about 5 pounds.  Immediately after delivery she required hospitalization for severely elevated blood pressure and preeclampsia.  She was subsequently gradually weaned off the antihypertensive medications and did well for the next roughly 6 months.  Her prepregnancy weight was about 250 pounds.  She was in generally good health until the spring when she developed exertional dyspnea and a persistent cough.  She was treated with inhalers and steroids and antibiotics without any improvement.  After roughly 6 weeks of treatment labs showed an elevated D-dimer and  she was referred for CT urography to exclude pulmonary embolism.  There was no evidence of pulmonary embolism, but she had evidence of congestive heart failure.  Follow-up echocardiography showed severely depressed left ventricular systolic function with EF 20%, dilated left ventricle (end-diastolic diameter 6.7 cm), dilated left atrium (end-systolic diameter 5.1 cm), moderate to severe mitral regurgitation, moderate to severe tricuspid regurgitation, diastolic dysfunction with elevation of left atrial filling pressures and a restrictive pattern of filling (mitral deceleration time 144 ms).  There was no evidence of coronary or aortic atherosclerosis on the CT of the chest or the subsequently performed CT of the abdomen and pelvis.  She was scheduled for an MRI of the heart but this has not been performed.  She also has significant right upper quadrant pain around the time of her diagnosis of cardiomyopathy.  She did have a gallstone identified by CT and confirmed by ultrasound.  Alicia's father Henderson Christensen is also my patient.  He has nonischemic cardiomyopathy with onset after the age of 45.  He had a remarkable improvement in LV systolic function from a low of 25% (by MRI) to normal range at 60 to 65% by echocardiography after taking 4 drug guideline directed medical therapy.  He also has morbid obesity and obesity hypoventilation syndrome.  Past Medical History:  Diagnosis Date   Environmental allergies    Frequent headaches    Gestational diabetes    Hx of  varicella    Migraine    Seasonal allergies    Vaginal Pap smear, abnormal     Past Surgical History:  Procedure Laterality Date   CESAREAN SECTION N/A 01/15/2016   Procedure: CESAREAN SECTION;  Surgeon: Olivia Mackie, MD;  Location: [facility];  Service: Obstetrics;  Laterality: N/A;   CESAREAN SECTION N/A 04/19/2021   Procedure: Repeat CESAREAN SECTION;  Surgeon: Olivia Mackie, MD;  Location: [facility];  Service: Obstetrics;   Laterality: N/A;  EDD: 04/29/21 Ok per Misty Stanley.   DILATION AND CURETTAGE OF UTERUS     WISDOM TOOTH EXTRACTION      Current Medications: Current Meds  Medication Sig   acetaminophen (TYLENOL) 325 MG tablet Take 2 tablets (650 mg total) by mouth every 6 (six) hours as needed for headache, fever, moderate pain or mild pain.   albuterol (VENTOLIN HFA) 108 (90 Base) MCG/ACT inhaler Inhale 2 puffs into the lungs every 6 (six) hours as needed for wheezing or shortness of breath.   cetirizine (ZYRTEC) 10 MG chewable tablet Chew 10 mg by mouth daily.   empagliflozin (JARDIANCE) 10 MG TABS tablet Take 1 tablet (10 mg total) by mouth daily before breakfast.   fluticasone (FLONASE) 50 MCG/ACT nasal spray Place 1 spray into both nostrils daily as needed for allergies.   iron polysaccharides (NIFEREX) 150 MG capsule Take 1 capsule (150 mg total) by mouth daily.   levocetirizine (XYZAL) 5 MG tablet Take 5 mg by mouth every evening.   sacubitril-valsartan (ENTRESTO) 97-103 MG Take 1 tablet by mouth 2 (two) times daily.   spironolactone (ALDACTONE) 25 MG tablet Take 1/2 tablet (12.5 mg total) by mouth daily.   [DISCONTINUED] carvedilol (COREG) 12.5 MG tablet Take 1 tablet (12.5 mg total) by mouth 2 (two) times daily with a meal.   [DISCONTINUED] furosemide (LASIX) 20 MG tablet Take two tablets (40 mg dose) by mouth 2 (two) times daily.     Allergies:   Amoxicillin, Hydrocodeine [dihydrocodeine], and Penicillins   Social History   Socioeconomic History   Marital status: Married    Spouse name: Not on file   Number of children: Not on file   Years of education: Not on file   Highest education level: Not on file  Occupational History   Not on file  Tobacco Use   Smoking status: Never   Smokeless tobacco: Never  Vaping Use   Vaping status: Never Used  Substance and Sexual Activity   Alcohol use: Yes    Alcohol/week: 0.0 standard drinks of alcohol    Comment: rare -- 3 x month   Drug use: No    Sexual activity: Yes    Partners: Male  Other Topics Concern   Not on file  Social History Narrative   Not on file   Social Drivers of Health   Financial Resource Strain: Not on file  Food Insecurity: No Food Insecurity (03/27/2022)   Received [facility]   Hunger Vital Sign    Worried About Running Out of Food in the Last Year: Never true    Ran Out of Food in the Last Year: Never true  Transportation Needs: Not on file  Physical Activity: Not on file  Stress: No Stress Concern Present (04/02/2022)   Received from [facility], [facility] of [facility]ational [facility] - Occupational Stress Questionnaire    Feeling of Stress : Not at all  Social Connections: Unknown (03/22/2022)   Received from [facility]   Social Network    Social Network: Not on file     Family History: The patient's family history includes Alcoholism in her father; Diabetes in her mother and paternal grandfather; Heart attack in her maternal grandfather and paternal grandfather; Heart disease in her paternal aunt; Hyperlipidemia in her mother; Hypertension in her maternal grandmother; Varicose Veins in her mother.  ROS:   Please see the history of present illness.     All other systems reviewed and are negative.  EKGs/Labs/Other Studies Reviewed:    The following studies were reviewed today: Report of echocardiogram from [facility] 03/27/2022  Adequate 2D M-mode and color flow Doppler echocardiogram demonstrates  prominently enlarged left and right ventricular chamber size.  There is  prominent global hypokinesis with ejection fraction estimated at  approximately 20%.  These findings are consistent with a dilated  cardiomyopathy  2.  The aortic, mitral, and tricuspid valves have grossly normal  appearance  3.  There is significant mitral and tricuspid insufficiency  4.  The atria are of roughly normal size bilaterally  5.  Pulmonary hypertension  is present  6.  Right ventricular systolic function is good  7.  The pericardium is of normal thickness there is a trivial  nonhemodynamically significant pericardial effusion.  LA Volume Index (BP) mL/m2 36.1   LA A-P Dimension cm 5.100   IVSd 0.9 - 1.6 cm 1.1   LVIDd 7.29 - 10.13 cm 6.770   LVIDs 4.22 - 6.39 cm 6.060   LVPWd 0.86 - 1.61 cm 1.110   MV E' Tissue Velocity Lateral cm/s 3.480   A4C EF % 19   Left Ventricular EF by 2-D Biplane by Method of Disks % 28.100   E/E' Lateral Ratio no units 14.500     Cardiac MRI 01/17/2023:  1.  Moderately dilatedl LV with EF 35%, diffuse hypokinesis. 2.  Normal RV size with mild systolic dysfunction, EF 44%. 3. No myocardial LGE, so no definitive evidence for prior MI, infiltrative disease, or myocarditis. 4. Extracellular volume percentage 30%, mildly (nonspecifically) elevated.  EKG:    EKG Interpretation Date/Time:  Tuesday October 01 2023 16:32:12 EST Ventricular Rate:  82 PR Interval:  150 QRS Duration:  96 QT Interval:  388 QTC Calculation: 453 R Axis:   -3  Text Interpretation: Normal sinus rhythm Nonspecific T wave abnormality No previous ECGs available Confirmed by Saige Canton [phone]) on 10/01/2023 4:45:28 PM         Recent Labs: 11/12/2022: BUN 9; Creatinine, Ser 0.73; Hemoglobin 12.6; Platelets 264; Potassium 3.9; Sodium 139  03/22/2022-04/03/2022 Sodium 137, potassium 4.0, creatinine 0.88 Hemoglobin 12.2 Troponin T 12-13 Rheumatoid factor and ANA negative, ESR 15 Normal transaminases, alkaline phosphatase and bilirubin TSH 1.22 Recent Lipid Panel No results found for: "CHOL", "TRIG", "HDL", "CHOLHDL", "VLDL", "LDLCALC", "LDLDIRECT" 06/11/2011 Cholesterol 110, HDL 27, LDL 55.6, triglycerides 139  Risk Assessment/Calculations:            Physical Exam:    VS:  BP 118/76 (BP Location: Left Arm, Patient Position: Sitting, Cuff Size: Normal)   Pulse 82   Ht 5' 4.5" (1.638 m)   Wt 278 lb (126.1 kg)    SpO2 99%   BMI 46.98 kg/m     Wt Readings from Last 3 Encounters:  10/01/23 278 lb (126.1 kg)  01/24/23 282 lb 9.6 oz (128.2 kg)  10/16/22 270 lb (122.5 kg)      General: Alert, oriented x3, no distress, morbidly obese Head: no evidence of  trauma, PERRL, EOMI, no exophtalmos or lid lag, no myxedema, no xanthelasma; normal ears, nose and oropharynx Neck: normal jugular venous pulsations and no hepatojugular reflux; brisk carotid pulses without delay and no carotid bruits Chest: clear to auscultation, no signs of consolidation by percussion or palpation, normal fremitus, symmetrical and full respiratory excursions Cardiovascular: normal position and quality of the apical impulse, regular rhythm, normal first and second heart sounds, no murmurs, rubs or gallops Abdomen: no tenderness or distention, no masses by palpation, no abnormal pulsatility or arterial bruits, normal bowel sounds, no hepatosplenomegaly Extremities: no clubbing, cyanosis or edema; 2+ radial, ulnar and brachial pulses bilaterally; 2+ right femoral, posterior tibial and dorsalis pedis pulses; 2+ left femoral, posterior tibial and dorsalis pedis pulses; no subclavian or femoral bruits Neurological: grossly nonfocal Psych: Normal mood and affect   ASSESSMENT:    1. Chronic combined systolic and diastolic congestive heart failure (HCC)   2. Nonischemic cardiomyopathy (HCC)    PLAN:    In order of problems listed above:  CHF: Clinically euvolemic.  NYHA functional class I-II.  She has responded well clinically to gradual titration of heart failure medications and no longer has resting tachycardia.  She is on maximum dose of Entresto and near maximum dose of carvedilol as well as spironolactone and SGLT2 inhibitor.  Most recent assessment of LVEF by MRI was 35%, so at this point we are not considering defibrillator implantation.  Will increase carvedilol to maximum dose and then reassess LV function.  We can also start  weaning down avoid furosemide.  Asked her to take just 20 mg once daily with continue monitoring her weight and let me know if she gains more than 3-5 pounds from today's weight. Cardiomyopathy: Nonischemic cardiomyopathy.  Her father had nonischemic cardiomyopathy with severely depressed LVEF that responded remarkably well to medical therapy, so it is possible there is a inheritable cardiomyopathy.  She had preeclampsia immediately after delivering her son more than a year ago, but her blood pressure is now very normal.  It is possible she had postpartum cardiomyopathy that did not manifest itself so noisily early on, but really her symptoms of heart failure only became evident 8 months or so after delivery, making postpartum cardiomyopathy unlikely.  Regardless, I strongly advised against further pregnancy.  She has no intention of having more children.              Medication Adjustments/Labs and Tests Ordered: Current medicines are reviewed at length with the patient today.  Concerns regarding medicines are outlined above.  Orders Placed This Encounter  Procedures   EKG 12-Lead   ECHOCARDIOGRAM COMPLETE   Meds ordered this encounter  Medications   carvedilol (COREG) 25 MG tablet    Sig: Take 1 tablet (25 mg total) by mouth 2 (two) times daily with a meal.    Dispense:  180 tablet    Refill:  3    Dose change new Rx   furosemide (LASIX) 20 MG tablet    Sig: Take 1 tablet (20 mg total) by mouth daily.    Dispense:  90 tablet    Refill:  3    Patient Instructions  Medication Instructions:  Carvedilol 25 mg twice daily Furosemide 20 mg daily *If you need a refill on your cardiac medications before your next appointment, please call your pharmacy*  Testing/Procedures: Your physician has requested that you have an echocardiogram. Echocardiography is a painless test that uses sound waves to create images of your heart. It provides  your doctor with information about the size and shape  of your heart and how well your heart's chambers and valves are working. This procedure takes approximately one hour. There are no restrictions for this procedure. Please do NOT wear cologne, perfume, aftershave, or lotions (deodorant is allowed). Please arrive 15 minutes prior to your appointment time.  Please note: We ask at that you not bring children with you during ultrasound (echo/ vascular) testing. Due to room size and safety concerns, children are not allowed in the ultrasound rooms during exams. Our front office staff cannot provide observation of children in our lobby area while testing is being conducted. An adult accompanying a patient to their appointment will only be allowed in the ultrasound room at the discretion of the ultrasound technician under special circumstances. We apologize for any inconvenience.    Follow-Up: At [facility], you and your health needs are our priority.  As part of our continuing mission to provide you with exceptional heart care, we have created designated Provider Care Teams.  These Care Teams include your primary Cardiologist (physician) and Advanced Practice Providers (APPs -  Physician Assistants and Nurse Practitioners) who all work together to provide you with the care you need, when you need it.  We recommend signing up for the patient portal called "MyChart".  Sign up information is provided on this After Visit Summary.  MyChart is used to connect with patients for Virtual Visits (Telemedicine).  Patients are able to view lab/test results, encounter notes, upcoming appointments, etc.  Non-urgent messages can be sent to your provider as well.   To learn more about what you can do with MyChart, go to [email or web address].    Your next appointment:   1 year(s)  Provider:   Thurmon Fair, MD           Signed, Thurmon Fair, MD  10/04/2023 1:56 PM    [facility]

## 2023-10-11 ENCOUNTER — Encounter: Payer: Self-pay | Admitting: Cardiovascular Disease

## 2023-10-15 ENCOUNTER — Other Ambulatory Visit: Payer: Self-pay | Admitting: Cardiovascular Disease

## 2023-10-21 ENCOUNTER — Other Ambulatory Visit: Payer: Self-pay | Admitting: Nurse Practitioner

## 2023-11-07 ENCOUNTER — Ambulatory Visit (HOSPITAL_COMMUNITY): Payer: 59 | Attending: Cardiology

## 2023-11-07 DIAGNOSIS — I5042 Chronic combined systolic (congestive) and diastolic (congestive) heart failure: Secondary | ICD-10-CM | POA: Insufficient documentation

## 2023-11-07 LAB — ECHOCARDIOGRAM COMPLETE
Area-P 1/2: 3.5 cm2
S' Lateral: 3.8 cm

## 2023-11-07 MED ORDER — PERFLUTREN LIPID MICROSPHERE
1.0000 mL | INTRAVENOUS | Status: AC | PRN
Start: 2023-11-07 — End: 2023-11-07
  Administered 2023-11-07: 2 mL via INTRAVENOUS

## 2023-11-08 ENCOUNTER — Other Ambulatory Visit (HOSPITAL_COMMUNITY): Payer: Self-pay | Admitting: Emergency Medicine

## 2023-11-08 ENCOUNTER — Encounter: Payer: Self-pay | Admitting: Cardiovascular Disease

## 2023-11-08 ENCOUNTER — Other Ambulatory Visit (HOSPITAL_COMMUNITY): Payer: Self-pay

## 2023-11-08 MED ORDER — FUROSEMIDE 20 MG PO TABS: 20.0000 mg | ORAL_TABLET | Freq: Every day | ORAL | Status: DC | PRN

## 2023-11-25 ENCOUNTER — Other Ambulatory Visit: Payer: Self-pay

## 2023-11-25 MED ORDER — SPIRONOLACTONE 25 MG PO TABS: 12.5000 mg | ORAL_TABLET | Freq: Every day | ORAL | 3 refills | Status: AC

## 2023-11-27 ENCOUNTER — Encounter: Payer: Self-pay | Admitting: Family Medicine

## 2023-11-27 ENCOUNTER — Ambulatory Visit: Payer: 59 | Admitting: Family Medicine

## 2023-11-27 VITALS — BP 109/73 | HR 92 | Temp 98.1°F | Resp 18 | Ht 64.5 in | Wt 275.6 lb

## 2023-11-27 DIAGNOSIS — I509 Heart failure, unspecified: Secondary | ICD-10-CM

## 2023-11-27 DIAGNOSIS — I429 Cardiomyopathy, unspecified: Secondary | ICD-10-CM

## 2023-11-27 DIAGNOSIS — Z7689 Persons encountering health services in other specified circumstances: Secondary | ICD-10-CM | POA: Diagnosis not present

## 2023-11-27 NOTE — Progress Notes (Signed)
New Patient Office Visit  Subjective    Patient ID: Alicia Christensen, female    DOB: 1986-06-28  Age: 38 y.o. MRN: [id number]  CC:  Chief Complaint  Patient presents with   Establish Care    Patient is here to establish care with a new PCP    HPI Alicia Christensen presents to establish care. Pt is new to me.  Pt is established with cardiologist for CHF after the birth of her child 2 years ago. Last BMP Jan 2024. She is taking Spironolactone 25 mg 1/2 tab daily, Entresto, Jardiance 10mg , Coreg 25 mg bid.  Pt's last pap smear Sept 2024 in [place].  She declines flu vaccine today.  She has no other concerns today, just establishing a medical home.    Outpatient Encounter Medications as of 11/27/2023  Medication Sig   acetaminophen (TYLENOL) 325 MG tablet Take 2 tablets (650 mg total) by mouth every 6 (six) hours as needed for headache, fever, moderate pain or mild pain.   albuterol (VENTOLIN HFA) 108 (90 Base) MCG/ACT inhaler Inhale 2 puffs into the lungs every 6 (six) hours as needed for wheezing or shortness of breath.   carvedilol (COREG) 25 MG tablet Take 1 tablet (25 mg total) by mouth 2 (two) times daily with a meal.   empagliflozin (JARDIANCE) 10 MG TABS tablet Take 1 tablet (10 mg total) by mouth daily before breakfast.   ENTRESTO 97-103 MG Take 1 tablet by mouth 2 (two) times daily.   levocetirizine (XYZAL) 5 MG tablet Take 5 mg by mouth every evening.   spironolactone (ALDACTONE) 25 MG tablet Take 0.5 tablets (12.5 mg total) by mouth daily.   cetirizine (ZYRTEC) 10 MG chewable tablet Chew 10 mg by mouth daily. (Patient not taking: Reported on 11/27/2023)   fluticasone (FLONASE) 50 MCG/ACT nasal spray Place 1 spray into both nostrils daily as needed for allergies. (Patient not taking: Reported on 11/27/2023)   furosemide (LASIX) 20 MG tablet Take 1 tablet (20 mg total) by mouth daily as needed for fluid or edema. (Patient not taking: Reported on 11/27/2023)   iron  polysaccharides (NIFEREX) 150 MG capsule Take 1 capsule (150 mg total) by mouth daily. (Patient not taking: Reported on 11/27/2023)   [DISCONTINUED] Ferrous Sulfate (IRON PO) Take 1 tablet by mouth every other day. (Patient not taking: Reported on 10/01/2023)   [DISCONTINUED] hydrOXYzine (VISTARIL) 25 MG capsule Take 1 capsule (25 mg total) by mouth every 8 (eight) hours as needed. (Patient not taking: Reported on 10/01/2023)   No facility-administered encounter medications on file as of 11/27/2023.    Past Medical History:  Diagnosis Date   Environmental allergies    Frequent headaches    Gestational diabetes    Hx of varicella    Migraine    Seasonal allergies    Vaginal Pap smear, abnormal     Past Surgical History:  Procedure Laterality Date   CESAREAN SECTION N/A 01/15/2016   Procedure: CESAREAN SECTION;  Surgeon: Olivia Mackie, MD;  Location: [facility];  Service: Obstetrics;  Laterality: N/A;   CESAREAN SECTION N/A 04/19/2021   Procedure: Repeat CESAREAN SECTION;  Surgeon: Olivia Mackie, MD;  Location: [facility];  Service: Obstetrics;  Laterality: N/A;  EDD: 04/29/21 Ok per Misty Stanley.   DILATION AND CURETTAGE OF UTERUS     WISDOM TOOTH EXTRACTION      Family History  Problem Relation Age of Onset   Alcoholism Father        Resolved-Living  Diabetes Mother        Liivng   Hyperlipidemia Mother    Varicose Veins Mother    Diabetes Paternal Grandfather    Heart attack Paternal Grandfather    Heart disease Paternal Aunt    Hypertension Maternal Grandmother    Heart attack Maternal Grandfather     Social History   Socioeconomic History   Marital status: Married    Spouse name: Not on file   Number of children: Not on file   Years of education: Not on file   Highest education level: Not on file  Occupational History   Not on file  Tobacco Use   Smoking status: Never    Passive exposure: Never   Smokeless tobacco: Never  Vaping Use   Vaping status: Never Used   Substance and Sexual Activity   Alcohol use: Yes    Alcohol/week: 0.0 standard drinks of alcohol    Comment: rare -- 3 x month   Drug use: Never   Sexual activity: Yes    Partners: Male    Birth control/protection: None  Other Topics Concern   Not on file  Social History Narrative   Not on file   Social Drivers of Health   Financial Resource Strain: Not on file  Food Insecurity: No Food Insecurity (03/27/2022)   Received from [facility], [facility]   Hunger Vital Sign    Worried About Running Out of Food in the Last Year: Never true    Ran Out of Food in the Last Year: Never true  Transportation Needs: Not on file  Physical Activity: Not on file  Stress: No Stress Concern Present (04/02/2022)   Received from [facility], [facility]   [employer] of Occupational Health - Occupational Stress Questionnaire    Feeling of Stress : Not at all  Social Connections: Unknown (03/22/2022)   Received from [facility], [facility]   Social Network    Social Network: Not on file  Intimate Partner Violence: Unknown (03/22/2022)   Received from [facility], [facility]   HITS    Physically Hurt: Not on file    Insult or Talk Down To: Not on file    Threaten Physical Harm: Not on file    Scream or Curse: Not on file    Review of Systems  All other systems reviewed and are negative.      Objective    BP 109/73   Pulse 92   Temp 98.1 F (36.7 C) (Oral)   Resp 18   Ht 5' 4.5" (1.638 m)   Wt 275 lb 9.6 oz (125 kg)   LMP 11/12/2023   SpO2 96%   BMI 46.58 kg/m   Physical Exam Vitals and nursing note reviewed.  Constitutional:      Appearance: Normal appearance. She is obese.  HENT:     Head: Normocephalic and atraumatic.     Right Ear: External ear normal.     Left Ear: External ear normal.     Nose: Nose normal.     Mouth/Throat:     Mouth: Mucous membranes are moist.     Pharynx: Oropharynx is clear.  Eyes:     Conjunctiva/sclera: Conjunctivae  normal.     Pupils: Pupils are equal, round, and reactive to light.  Cardiovascular:     Rate and Rhythm: Normal rate and regular rhythm.     Pulses: Normal pulses.     Heart sounds: Normal heart sounds.  Pulmonary:  Effort: Pulmonary effort is normal.     Breath sounds: Normal breath sounds.  Skin:    General: Skin is warm.     Capillary Refill: Capillary refill takes less than 2 seconds.  Neurological:     General: No focal deficit present.     Mental Status: She is alert and oriented to person, place, and time. Mental status is at baseline.  Psychiatric:        Mood and Affect: Mood normal.        Behavior: Behavior normal.        Thought Content: Thought content normal.        Judgment: Judgment normal.       Assessment & Plan:   Problem List Items Addressed This Visit   None  Encounter to establish care with new doctor  Congestive heart failure with cardiomyopathy (HCC)  Continue follow up with cardiologist as scheduled To return for CPE/labs. No follow-ups on file.   Suzan Slick, MD

## 2023-12-05 ENCOUNTER — Ambulatory Visit (INDEPENDENT_AMBULATORY_CARE_PROVIDER_SITE_OTHER): Payer: 59 | Admitting: Family Medicine

## 2023-12-05 ENCOUNTER — Encounter: Payer: Self-pay | Admitting: Family Medicine

## 2023-12-05 VITALS — BP 125/80 | HR 94 | Temp 97.8°F | Resp 18 | Ht 64.5 in | Wt 277.5 lb

## 2023-12-05 DIAGNOSIS — R7302 Impaired glucose tolerance (oral): Secondary | ICD-10-CM

## 2023-12-05 DIAGNOSIS — Z13220 Encounter for screening for lipoid disorders: Secondary | ICD-10-CM | POA: Diagnosis not present

## 2023-12-05 DIAGNOSIS — Z136 Encounter for screening for cardiovascular disorders: Secondary | ICD-10-CM | POA: Diagnosis not present

## 2023-12-05 DIAGNOSIS — Z0000 Encounter for general adult medical examination without abnormal findings: Secondary | ICD-10-CM

## 2023-12-05 NOTE — Progress Notes (Signed)
Complete physical exam  Patient: Alicia Christensen   DOB: 1985-12-15   37 y.o. Female  MRN: [id number]  Subjective:    Chief Complaint  Patient presents with   Annual Exam    SHANIRA TINE is a 38 y.o. female who presents today for a complete physical exam. She reports consuming a general diet.  Walking at the [facility]  She generally feels well. She reports sleeping fairly well. She does not have additional problems to discuss today.  Pt has had pap smear in October 2024.  Most recent fall risk assessment:    11/27/2023    1:46 PM  Fall Risk   Falls in the past year? 0  Number falls in past yr: 0  Injury with Fall? 0  Risk for fall due to : No Fall Risks  Follow up Falls evaluation completed     Most recent depression screenings:    11/27/2023    1:46 PM 12/04/2017    2:02 PM  PHQ 2/9 Scores  PHQ - 2 Score 0 0  PHQ- 9 Score 3     Vision:Within last year  Patient Active Problem List   Diagnosis Date Noted   Congestive heart failure with cardiomyopathy (HCC) 04/02/2022   Maternal anemia, with delivery 04/20/2021   Repeat low transverse cesarean section 7/6 04/19/2021   Postpartum care following cesarean delivery 7/6 04/19/2021   Gestational diabetes mellitus treated with oral hypoglycemic therapy 04/19/2021   Previous cesarean delivery affecting pregnancy 04/19/2021   Severe obesity (BMI >= 40) (HCC) 09/03/2015   Past Medical History:  Diagnosis Date   Environmental allergies    Frequent headaches    Gestational diabetes    Hx of varicella    Migraine    Seasonal allergies    Vaginal Pap smear, abnormal    Past Surgical History:  Procedure Laterality Date   CESAREAN SECTION N/A 01/15/2016   Procedure: CESAREAN SECTION;  Surgeon: Olivia Mackie, MD;  Location: [facility];  Service: Obstetrics;  Laterality: N/A;   CESAREAN SECTION N/A 04/19/2021   Procedure: Repeat CESAREAN SECTION;  Surgeon: Olivia Mackie, MD;  Location: [facility];  Service: Obstetrics;  Laterality:  N/A;  EDD: 04/29/21 Ok per Misty Stanley.   DILATION AND CURETTAGE OF UTERUS     WISDOM TOOTH EXTRACTION     Social History   Tobacco Use   Smoking status: Never    Passive exposure: Never   Smokeless tobacco: Never  Vaping Use   Vaping status: Never Used  Substance Use Topics   Alcohol use: Yes    Alcohol/week: 0.0 standard drinks of alcohol    Comment: rare -- 3 x month   Drug use: Never   Family Status  Relation Name Status   Father  (Not Specified)   Mother  (Not Specified)   PGF  (Not Specified)   Dennie Bible Aunt  (Not Specified)   MGM  (Not Specified)   MGF  (Not Specified)  No partnership data on file   Allergies  Allergen Reactions   Amoxicillin Nausea And Vomiting   Hydrocodeine [Dihydrocodeine] Nausea And Vomiting   Penicillins Nausea And Vomiting    Has patient had a PCN reaction causing immediate rash, facial/tongue/throat swelling, SOB or lightheadedness with hypotension: No Has patient had a PCN reaction causing severe rash involving mucus membranes or skin necrosis: No Has patient had a PCN reaction that required hospitalization No Has patient had a PCN reaction occurring within the last 10 years: No If all of  the above answers are "NO", then may proceed with Cephalosporin use.     Patient Care Team: Suzan Slick, MD as PCP - General (Family Medicine) Croitoru, Rachelle Hora, MD as PCP - Cardiology (Cardiology)   Outpatient Medications Prior to Visit  Medication Sig   acetaminophen (TYLENOL) 325 MG tablet Take 2 tablets (650 mg total) by mouth every 6 (six) hours as needed for headache, fever, moderate pain or mild pain.   albuterol (VENTOLIN HFA) 108 (90 Base) MCG/ACT inhaler Inhale 2 puffs into the lungs every 6 (six) hours as needed for wheezing or shortness of breath.   carvedilol (COREG) 25 MG tablet Take 1 tablet (25 mg total) by mouth 2 (two) times daily with a meal.   empagliflozin (JARDIANCE) 10 MG TABS tablet Take 1 tablet (10 mg total) by mouth daily before  breakfast.   ENTRESTO 97-103 MG Take 1 tablet by mouth 2 (two) times daily.   levocetirizine (XYZAL) 5 MG tablet Take 5 mg by mouth every evening.   spironolactone (ALDACTONE) 25 MG tablet Take 0.5 tablets (12.5 mg total) by mouth daily.   No facility-administered medications prior to visit.    Review of Systems  All other systems reviewed and are negative.        Objective:     BP 125/80   Pulse 94   Temp 97.8 F (36.6 C) (Oral)   Resp 18   Ht 5' 4.5" (1.638 m)   Wt 277 lb 8 oz (125.9 kg)   LMP 11/12/2023   SpO2 97%   BMI 46.90 kg/m  BP Readings from Last 3 Encounters:  12/05/23 125/80  11/27/23 109/73  10/01/23 118/76      Physical Exam Vitals and nursing note reviewed.  Constitutional:      Appearance: Normal appearance. She is obese.  HENT:     Head: Normocephalic and atraumatic.     Right Ear: Tympanic membrane, ear canal and external ear normal.     Left Ear: Tympanic membrane, ear canal and external ear normal.     Nose: Nose normal.     Mouth/Throat:     Mouth: Mucous membranes are moist.     Pharynx: Oropharynx is clear.  Eyes:     Conjunctiva/sclera: Conjunctivae normal.     Pupils: Pupils are equal, round, and reactive to light.  Cardiovascular:     Rate and Rhythm: Normal rate and regular rhythm.     Pulses: Normal pulses.     Heart sounds: Normal heart sounds.  Pulmonary:     Effort: Pulmonary effort is normal.     Breath sounds: Normal breath sounds.  Abdominal:     General: Abdomen is flat. Bowel sounds are normal.  Skin:    General: Skin is warm.     Capillary Refill: Capillary refill takes less than 2 seconds.  Neurological:     General: No focal deficit present.     Mental Status: She is alert and oriented to person, place, and time. Mental status is at baseline.  Psychiatric:        Mood and Affect: Mood normal.        Behavior: Behavior normal.        Thought Content: Thought content normal.        Judgment: Judgment normal.      No results found for any visits on 12/05/23. Last CBC Lab Results  Component Value Date   WBC 8.5 11/12/2022   HGB 12.6 11/12/2022   HCT 39.1 11/12/2022  MCV 86 11/12/2022   MCH 27.7 11/12/2022   RDW 15.5 (H) 11/12/2022   PLT 264 11/12/2022   Last metabolic panel Lab Results  Component Value Date   GLUCOSE 126 (H) 11/12/2022   NA 139 11/12/2022   K 3.9 11/12/2022   CL 102 11/12/2022   CO2 24 11/12/2022   BUN 9 11/12/2022   CREATININE 0.73 11/12/2022   EGFR 111 11/12/2022   CALCIUM 9.0 11/12/2022   PROT 6.1 (L) 04/25/2021   ALBUMIN 2.8 (L) 04/25/2021   BILITOT 0.6 04/25/2021   ALKPHOS 85 04/25/2021   AST 21 04/25/2021   ALT 21 04/25/2021   ANIONGAP 7 04/25/2021   Last lipids No results found for: "CHOL", "HDL", "LDLCALC", "LDLDIRECT", "TRIG", "CHOLHDL" Last hemoglobin A1c No results found for: "HGBA1C"      Assessment & Plan:    Routine Health Maintenance and Physical Exam  Immunization History  Administered Date(s) Administered   Influenza,inj,Quad PF,6+ Mos 08/16/2016, 08/21/2017, 07/07/2018, 08/01/2019, 07/30/2020, 07/30/2020, 07/24/2021, 07/24/2021, 09/03/2022   PFIZER Comirnaty(Gray Top)Covid-19 Tri-Sucrose Vaccine 12/18/2019, 01/08/2020   Td 09/05/2009   Td (Adult),5 Lf Tetanus Toxid, Preservative Free 09/05/2009    Health Maintenance  Topic Date Due   Pneumococcal Vaccine 17-38 Years old (1 of 2 - PCV) Never done   Hepatitis C Screening  Never done   Cervical Cancer Screening (HPV/Pap Cotest)  07/13/2018   DTaP/Tdap/Td (3 - Tdap) 09/06/2019   COVID-19 Vaccine (3 - Pfizer risk series) 02/05/2020   INFLUENZA VACCINE  05/16/2023   HIV Screening  Completed   HPV VACCINES  Aged Out    Discussed health benefits of physical activity, and encouraged her to engage in regular exercise appropriate for her age and condition.  Problem List Items Addressed This Visit   None  No follow-ups on file. Annual physical exam  Encounter for lipid screening  for cardiovascular disease -     Lipid panel  Impaired glucose tolerance -     CBC with Differential/Platelet -     Comprehensive metabolic panel -     Hemoglobin A1c   Screening labs today See in 1 year prn    Meral Geissinger Sherie Don, MD

## 2023-12-06 ENCOUNTER — Encounter: Payer: Self-pay | Admitting: Family Medicine

## 2023-12-06 LAB — CBC WITH DIFFERENTIAL/PLATELET
Basophils Absolute: 0 10*3/uL (ref 0.0–0.2)
Basos: 1 %
EOS (ABSOLUTE): 0.1 10*3/uL (ref 0.0–0.4)
Eos: 1 %
Hematocrit: 39.5 % (ref 34.0–46.6)
Hemoglobin: 12.9 g/dL (ref 11.1–15.9)
Immature Grans (Abs): 0.1 10*3/uL (ref 0.0–0.1)
Immature Granulocytes: 1 %
Lymphocytes Absolute: 1.9 10*3/uL (ref 0.7–3.1)
Lymphs: 22 %
MCH: 27.8 pg (ref 26.6–33.0)
MCHC: 32.7 g/dL (ref 31.5–35.7)
MCV: 85 fL (ref 79–97)
Monocytes Absolute: 0.3 10*3/uL (ref 0.1–0.9)
Monocytes: 4 %
Neutrophils Absolute: 6.3 10*3/uL (ref 1.4–7.0)
Neutrophils: 71 %
Platelets: 254 10*3/uL (ref 150–450)
RBC: 4.64 x10E6/uL (ref 3.77–5.28)
RDW: 15.6 % — ABNORMAL HIGH (ref 11.7–15.4)
WBC: 8.6 10*3/uL (ref 3.4–10.8)

## 2023-12-06 LAB — COMPREHENSIVE METABOLIC PANEL
ALT: 22 [IU]/L (ref 0–32)
AST: 18 [IU]/L (ref 0–40)
Albumin: 4.1 g/dL (ref 3.9–4.9)
Alkaline Phosphatase: 125 [IU]/L — ABNORMAL HIGH (ref 44–121)
BUN/Creatinine Ratio: 17 (ref 9–23)
BUN: 11 mg/dL (ref 6–20)
Bilirubin Total: 0.2 mg/dL (ref 0.0–1.2)
CO2: 23 mmol/L (ref 20–29)
Calcium: 9.1 mg/dL (ref 8.7–10.2)
Chloride: 101 mmol/L (ref 96–106)
Creatinine, Ser: 0.65 mg/dL (ref 0.57–1.00)
Globulin, Total: 2.5 g/dL (ref 1.5–4.5)
Glucose: 178 mg/dL — ABNORMAL HIGH (ref 70–99)
Potassium: 4.4 mmol/L (ref 3.5–5.2)
Sodium: 139 mmol/L (ref 134–144)
Total Protein: 6.6 g/dL (ref 6.0–8.5)
eGFR: 118 mL/min/{1.73_m2} (ref >59)

## 2023-12-06 LAB — LIPID PANEL
Chol/HDL Ratio: 6.1 {ratio} — ABNORMAL HIGH (ref 0.0–4.4)
Cholesterol, Total: 159 mg/dL (ref 100–199)
HDL: 26 mg/dL — ABNORMAL LOW (ref >39)
Triglycerides: 840 mg/dL (ref 0–149)

## 2023-12-06 LAB — HEMOGLOBIN A1C
Est. average glucose Bld gHb Est-mCnc: 157 mg/dL
Hgb A1c MFr Bld: 7.1 % — ABNORMAL HIGH (ref 4.8–5.6)

## 2024-01-01 ENCOUNTER — Telehealth: Admitting: Family Medicine

## 2024-01-01 DIAGNOSIS — J069 Acute upper respiratory infection, unspecified: Secondary | ICD-10-CM

## 2024-01-01 MED ORDER — PROMETHAZINE-DM 6.25-15 MG/5ML PO SYRP
5.0000 mL | ORAL_SOLUTION | Freq: Four times a day (QID) | ORAL | 0 refills | Status: DC | PRN
Start: 2024-01-01 — End: 2024-06-25

## 2024-01-01 NOTE — Progress Notes (Signed)
 E-Visit for Cough   We are sorry that you are not feeling well.  Here is how we plan to help!  Based on your presentation I believe you most likely have A cough due to a virus.  This is called viral bronchitis and is best treated by rest, plenty of fluids and control of the cough.  You may use Ibuprofen or Tylenol as directed to help your symptoms.  And or allergy driven given the spring blooming season starting up.      In addition you may use mucinex plain I will order you a cough syrup to help- it makes you sleepy- but will dry out some drainage and reduce cough.   From your responses in the eVisit questionnaire you describe inflammation in the upper respiratory tract which is causing a significant cough.  This is commonly called Bronchitis and has four common causes:   Allergies Viral Infections Acid Reflux Bacterial Infection Allergies, viruses and acid reflux are treated by controlling symptoms or eliminating the cause. An example might be a cough caused by taking certain blood pressure medications. You stop the cough by changing the medication. Another example might be a cough caused by acid reflux. Controlling the reflux helps control the cough.  USE OF BRONCHODILATOR ("RESCUE") INHALERS: There is a risk from using your bronchodilator too frequently.  The risk is that over-reliance on a medication which only relaxes the muscles surrounding the breathing tubes can reduce the effectiveness of medications prescribed to reduce swelling and congestion of the tubes themselves.  Although you feel brief relief from the bronchodilator inhaler, your asthma may actually be worsening with the tubes becoming more swollen and filled with mucus.  This can delay other crucial treatments, such as oral steroid medications. If you need to use a bronchodilator inhaler daily, several times per day, you should discuss this with your provider.  There are probably better treatments that could be used to keep your  asthma under control.     HOME CARE Only take medications as instructed by your medical team. Complete the entire course of an antibiotic. Drink plenty of fluids and get plenty of rest. Avoid close contacts especially the very young and the elderly Cover your mouth if you cough or cough into your sleeve. Always remember to wash your hands A steam or ultrasonic humidifier can help congestion.   GET HELP RIGHT AWAY IF: You develop worsening fever. You become short of breath You cough up blood. Your symptoms persist after you have completed your treatment plan MAKE SURE YOU  Understand these instructions. Will watch your condition. Will get help right away if you are not doing well or get worse.    Thank you for choosing an e-visit.  Your e-visit answers were reviewed by a board certified advanced clinical practitioner to complete your personal care plan. Depending upon the condition, your plan could have included both over the counter or prescription medications.  Please review your pharmacy choice. Make sure the pharmacy is open so you can pick up prescription now. If there is a problem, you may contact your provider through [employer] and have the prescription routed to another pharmacy.  Your safety is important to [place]. If you have drug allergies check your prescription carefully.   For the next 24 hours you can use MyChart to ask questions about today's visit, request a non-urgent call back, or ask for a work or school excuse. You will get an email in the next two days asking  about your experience. I hope that your e-visit has been valuable and will speed your recovery.  I provided 5 minutes of non face-to-face time during this encounter for chart review, medication and order placement, as well as and documentation.

## 2024-01-27 ENCOUNTER — Other Ambulatory Visit: Payer: Self-pay | Admitting: Cardiovascular Disease

## 2024-05-15 ENCOUNTER — Encounter: Payer: Self-pay | Admitting: Cardiovascular Disease

## 2024-05-21 ENCOUNTER — Encounter: Payer: Self-pay | Admitting: Family Medicine

## 2024-06-24 ENCOUNTER — Telehealth: Admitting: Nurse Practitioner

## 2024-06-24 DIAGNOSIS — B9789 Other viral agents as the cause of diseases classified elsewhere: Secondary | ICD-10-CM

## 2024-06-24 DIAGNOSIS — J019 Acute sinusitis, unspecified: Secondary | ICD-10-CM | POA: Diagnosis not present

## 2024-06-25 MED ORDER — PROMETHAZINE-DM 6.25-15 MG/5ML PO SYRP
5.0000 mL | ORAL_SOLUTION | Freq: Four times a day (QID) | ORAL | 0 refills | Status: AC | PRN
Start: 2024-06-25 — End: ?

## 2024-06-25 MED ORDER — IPRATROPIUM BROMIDE 0.03 % NA SOLN
2.0000 | Freq: Two times a day (BID) | NASAL | 12 refills | Status: AC
Start: 2024-06-25 — End: ?

## 2024-06-25 NOTE — Progress Notes (Signed)
 E-Visit for Sinus Problems  We are sorry that you are not feeling well.  Here is how we plan to help!  Providers prescribe antibiotics to treat infections caused by bacteria. Antibiotics are very powerful in treating bacterial infections when they are used properly. To maintain their effectiveness, they should be used only when necessary. Overuse of antibiotics has resulted in the development of superbugs that are resistant to treatment!    After careful review of your answers, I would not recommend an antibiotic for your condition.  Antibiotics are not effective against viruses and therefore should not be used to treat them. Common examples of infections caused by viruses include colds and flu   If you are not feeling better in 5-7 days please feel free to reach back out to us  for reconsideration of antibiotics.   Based on what you have shared with me it looks like you have sinusitis.  Sinusitis is inflammation and infection in the sinus cavities of the head.  Based on your presentation I believe you most likely have Acute Viral Sinusitis.This is an infection most likely caused by a virus. There is not specific treatment for viral sinusitis other than to help you with the symptoms until the infection runs its course.  You may use an oral decongestant such as Mucinex D or if you have glaucoma or high blood pressure use plain Mucinex. Saline nasal spray help and can safely be used as often as needed for congestion, I have prescribed: an atrovent  nose spray and cough syrup  Some authorities believe that zinc sprays or the use of Echinacea may shorten the course of your symptoms.  Sinus infections are not as easily transmitted as other respiratory infection, however we still recommend that you avoid close contact with loved ones, especially the very young and elderly.  Remember to wash your hands thoroughly throughout the day as this is the number one way to prevent the spread of infection!  Home  Care: Only take medications as instructed by your medical team. Do not take these medications with alcohol. A steam or ultrasonic humidifier can help congestion.  You can place a towel over your head and breathe in the steam from hot water  coming from a faucet. Avoid close contacts especially the very young and the elderly. Cover your mouth when you cough or sneeze. Always remember to wash your hands.  Get Help Right Away If: You develop worsening fever or sinus pain. You develop a severe head ache or visual changes. Your symptoms persist after you have completed your treatment plan.  Make sure you Understand these instructions. Will watch your condition. Will get help right away if you are not doing well or get worse.   Thank you for choosing an e-visit.  Your e-visit answers were reviewed by a board certified advanced clinical practitioner to complete your personal care plan. Depending upon the condition, your plan could have included both over the counter or prescription medications.  Please review your pharmacy choice. Make sure the pharmacy is open so you can pick up prescription now. If there is a problem, you may contact your provider through [employer] and have the prescription routed to another pharmacy.  Your safety is important to us . If you have drug allergies check your prescription carefully.   For the next 24 hours you can use MyChart to ask questions about today's visit, request a non-urgent call back, or ask for a work or school excuse. You will get an email in the  next two days asking about your experience. I hope that your e-visit has been valuable and will speed your recovery.

## 2024-06-25 NOTE — Progress Notes (Signed)
 I have spent 5 minutes in review of e-visit questionnaire, review and updating patient chart, medical decision making and response to patient.   Claiborne Rigg, NP

## 2024-06-25 NOTE — Progress Notes (Signed)
 Message sent to patient requesting further input regarding current symptoms. Awaiting patient response.

## 2024-10-12 ENCOUNTER — Telehealth: Payer: Self-pay | Admitting: Pharmacist Clinician (PhC)/ Clinical Pharmacy Specialist

## 2024-10-12 ENCOUNTER — Encounter: Payer: Self-pay | Admitting: Cardiovascular Disease

## 2024-10-12 ENCOUNTER — Ambulatory Visit: Attending: Cardiovascular Disease | Admitting: Cardiovascular Disease

## 2024-10-12 VITALS — BP 110/74 | HR 82 | Ht 65.0 in | Wt 262.6 lb

## 2024-10-12 DIAGNOSIS — I428 Other cardiomyopathies: Secondary | ICD-10-CM | POA: Diagnosis not present

## 2024-10-12 DIAGNOSIS — I5042 Chronic combined systolic (congestive) and diastolic (congestive) heart failure: Secondary | ICD-10-CM | POA: Insufficient documentation

## 2024-10-12 DIAGNOSIS — E119 Type 2 diabetes mellitus without complications: Secondary | ICD-10-CM | POA: Diagnosis not present

## 2024-10-12 DIAGNOSIS — E782 Mixed hyperlipidemia: Secondary | ICD-10-CM | POA: Insufficient documentation

## 2024-10-12 NOTE — Telephone Encounter (Signed)
 Patient was seeing Dr. Francyne in the office today.  Asked about weight management medications.  She currently has a diagnosis of DM2, with her most recent A1c at 7.1 (11/2023).   First diagnosed with DM during pregnancy.   Was able to lose weight before having kids, took an injectable, but can't recall the name. Hasn't been able to lose any significant weight since having her two sons.    Patient has not met goal of at least 5% of body weight loss with comprehensive lifestyle modifications alone in the past 3-6 months. Pharmacotherapy is appropriate to pursue as augmentation. Will start Mounjaro.   Advised patient on common side effects including nausea, diarrhea, dyspepsia, decreased appetite, and fatigue. Counseled patient on reducing meal size and how to titrate medication to minimize side effects. Patient aware to call if intolerable side effects or if experiencing dehydration, abdominal pain, or dizziness. Patient will adhere to dietary modifications and will target at least 150 minutes of moderate intensity exercise weekly, plus resistance training twice a week (as recommended by the American Heart Association). This resistance training--such as weightlifting, bodyweight exercises, or using resistance bands, adapted to the patient's ability--will help prevent muscle loss.  Injection technique reviewed at today's visit.

## 2024-10-12 NOTE — Patient Instructions (Signed)
 Medication Instructions:  No changes- Pharmacist will speak with you about medication options *If you need a refill on your cardiac medications before your next appointment, please call your pharmacy*  Lab Work: No changes If you have labs (blood work) drawn today and your tests are completely normal, you will receive your results only by: MyChart Message (if you have MyChart) OR A paper copy in the mail If you have any lab test that is abnormal or we need to change your treatment, we will call you to review the results.  Testing/Procedures: None ordered  Follow-Up: At [facility], you and your health needs are our priority.  As part of our continuing mission to provide you with exceptional heart care, our providers are all part of one team.  This team includes your primary Cardiologist (physician) and Advanced Practice Providers or APPs (Physician Assistants and Nurse Practitioners) who all work together to provide you with the care you need, when you need it.  Your next appointment:   1 year(s)  Provider:   Jerel Balding, MD    We recommend signing up for the patient portal called MyChart.  Sign up information is provided on this After Visit Summary.  MyChart is used to connect with patients for Virtual Visits (Telemedicine).  Patients are able to view lab/test results, encounter notes, upcoming appointments, etc.  Non-urgent messages can be sent to your provider as well.   To learn more about what you can do with MyChart, go to [email or web address].

## 2024-10-12 NOTE — Progress Notes (Signed)
 " Cardiology Office Note:    Date:  10/12/2024   ID:  Alicia Christensen, DOB May 19, 1986, MRN [id number]  PCP:  Alicia Torrence GRADE, MD   [facility] Providers Cardiologist:  Alicia Balding, MD     Referring MD: Alicia Torrence GRADE, MD   Chief Complaint  Patient presents with   Congestive Heart Failure     History of Present Illness:    Alicia Christensen is a 38 y.o. female with a hx of recently diagnosed congestive heart failure due to severe cardiomyopathy (left ventricular systolic function around 20%).   Her father Alicia Christensen has also been my patient for several years (history of nonischemic cardiomyopathy), but he is currently very ill due to melanoma metastatic to the brain.  He is now essentially bedridden.  This is weighing a lot on Alicia Christensen's overall mood.  From a heart failure point of view she appears to be very well compensated, NYHA functional class I.  She denies dyspnea at rest or with activity, orthopnea, PND, lower extremity edema.  She is not requiring loop diuretics.  She has not had dizziness, palpitations or syncope.  She is on maximum doses of Entresto  and carvedilol  as well as on Jardiance  and spironolactone .  Her most recent echocardiogram showed improved LV function with ejection fraction of 35-40%, up from 20% at her original presentation in 2023.  That study was interpreted as showing regional wall motion abnormalities, but I disagree.  She has normal renal function, but has worsening glycemic control.  Despite treatment with Jardiance  she has full-blown diabetes mellitus with a hemoglobin A1c of 7.1%.  Her triglycerides were quite high earlier this year at 840 and her HDL is low at 26.  Alicia Christensen delivered her second child, a baby boy in July 2022.  She had been fairly sick during the pregnancy and had only gained about 5 pounds.  Immediately after delivery she required hospitalization for severely elevated blood pressure and preeclampsia.  She was subsequently  gradually weaned off the antihypertensive medications and did well for the next roughly 6 months.  Her prepregnancy weight was about 250 pounds.  She was in generally good health until the spring when she developed exertional dyspnea and a persistent cough.  She was treated with inhalers and steroids and antibiotics without any improvement.  After roughly 6 weeks of treatment labs showed an elevated D-dimer and she was referred for CT urography to exclude pulmonary embolism.  There was no evidence of pulmonary embolism, but she had evidence of congestive heart failure.  Follow-up echocardiography showed severely depressed left ventricular systolic function with EF 20%, dilated left ventricle (end-diastolic diameter 6.7 cm), dilated left atrium (end-systolic diameter 5.1 cm), moderate to severe mitral regurgitation, moderate to severe tricuspid regurgitation, diastolic dysfunction with elevation of left atrial filling pressures and a restrictive pattern of filling (mitral deceleration time 144 ms).  There was no evidence of coronary or aortic atherosclerosis on the CT of the chest or the subsequently performed CT of the abdomen and pelvis.  She was scheduled for an MRI of the heart but this has not been performed.  She also has significant right upper quadrant pain around the time of her diagnosis of cardiomyopathy.  She did have a gallstone identified by CT and confirmed by ultrasound.  Alicia Christensen's father Alicia Christensen is also my patient.  He has nonischemic cardiomyopathy with onset after the age of 48.  He had a remarkable improvement in LV systolic function from a low of 25% (  by MRI) to normal range at 60 to 65% by echocardiography after taking 4 drug guideline directed medical therapy.  He also has morbid obesity and obesity hypoventilation syndrome.  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, to the brain  Past Medical History:  Diagnosis Date   Environmental allergies    Frequent  headaches    Gestational diabetes    Hx of varicella    Migraine    Seasonal allergies    Vaginal Pap smear, abnormal     Past Surgical History:  Procedure Laterality Date   CESAREAN SECTION N/A 01/15/2016   Procedure: CESAREAN SECTION;  Surgeon: Alicia Flowers, MD;  Location: [facility];  Service: Obstetrics;  Laterality: N/A;   CESAREAN SECTION N/A 04/19/2021   Procedure: Repeat CESAREAN SECTION;  Surgeon: Christensen Charlie, MD;  Location: [facility];  Service: Obstetrics;  Laterality: N/A;  EDD: 04/29/21 Ok per Alicia Christensen.   DILATION AND CURETTAGE OF UTERUS     WISDOM TOOTH EXTRACTION      Current Medications: Current Meds  Medication Sig   acetaminophen  (TYLENOL ) 325 MG tablet Take 2 tablets (650 mg total) by mouth every 6 (six) hours as needed for headache, fever, moderate pain or mild pain.   carvedilol  (COREG ) 25 MG tablet Take 1 tablet (25 mg total) by mouth 2 (two) times daily with a meal.   empagliflozin  (JARDIANCE ) 10 MG TABS tablet Take 1 tablet (10 mg total) by mouth daily before breakfast.   ENTRESTO  97-103 MG Take 1 tablet by mouth 2 (two) times daily.   ipratropium (ATROVENT ) 0.03 % nasal spray Place 2 sprays into both nostrils every 12 (twelve) hours.   levocetirizine (XYZAL) 5 MG tablet Take 5 mg by mouth every evening.   spironolactone  (ALDACTONE ) 25 MG tablet Take 0.5 tablets (12.5 mg total) by mouth daily.     Allergies:   Amoxicillin, Hydrocodeine [dihydrocodeine], and Penicillins       Family History: The patient's family history includes Alcoholism in her father; Diabetes in her mother and paternal grandfather; Heart attack in her maternal grandfather and paternal grandfather; Heart disease in her paternal aunt; Hyperlipidemia in her mother; Hypertension in her maternal grandmother; Varicose Veins in her mother.  ROS:   Please see the history of present illness.     All other systems reviewed and are negative.  EKGs/Labs/Other Studies Reviewed:    The following  studies were reviewed today: Echocardiogram 11/07/2023   1. The left ventricle demonstrates both global hypokinesis and regional  wall motion abnormalities (see scoring diagram/findings for description -  entire anterior wall, entire septum, and apex are hypokinetic) . Left  ventricular ejection fraction, by  estimation, is 35 to 40%. The left ventricle has moderately decreased  function. The left ventricular internal cavity size was upper limits of  normal. Left ventricular diastolic parameters are consistent with Christensen I  diastolic dysfunction (impaired  relaxation).   2. Right ventricular systolic function is normal. The right ventricular  size is normal.   3. The mitral valve is normal in structure. Mild mitral valve  regurgitation. No evidence of mitral stenosis.   4. The aortic valve was not well visualized. Aortic valve regurgitation  is not visualized. No aortic stenosis is present.   5. The inferior vena cava is normal in size with greater than 50%  respiratory variability, suggesting right atrial pressure of 3 mmHg.   Comparison(s): A prior study was performed on 10/12/2022. LVEF was 25-30%  and now 35-40%, Christensen 2 diastolic dysfunction is  now Christensen 1, current  study notes both global and regional wall motion.     Cardiac MRI 01/17/2023:  1.  Moderately dilatedl LV with EF 35%, diffuse hypokinesis. 2.  Normal RV size with mild systolic dysfunction, EF 44%. 3. No myocardial LGE, so no definitive evidence for prior MI, infiltrative disease, or myocarditis. 4. Extracellular volume percentage 30%, mildly (nonspecifically) elevated.  EKG:    EKG Interpretation Date/Time:  Monday October 12 2024 09:39:00 EST Ventricular Rate:  82 PR Interval:  152 QRS Duration:  94 QT Interval:  388 QTC Calculation: 453 R Axis:   0  Text Interpretation: Normal sinus rhythm Nonspecific T wave abnormality When compared with ECG of 01-Oct-2023 16:32, No significant change was found  Confirmed by Aralyn Nowak ([id number]) on 10/12/2024 9:56:45 AM         Recent Labs: 12/05/2023: ALT 22; BUN 11; Creatinine, Ser 0.65; Hemoglobin 12.9; Platelets 254; Potassium 4.4; Sodium 139  03/22/2022-04/03/2022 Sodium 137, potassium 4.0, creatinine 0.88 Hemoglobin 12.2 Troponin T 12-13 Rheumatoid factor and ANA negative, ESR 15 Normal transaminases, alkaline phosphatase and bilirubin TSH 1.22 Recent Lipid Panel    Component Value Date/Time   CHOL 159 12/05/2023 1514   TRIG 840 (HH) 12/05/2023 1514   HDL 26 (L) 12/05/2023 1514   CHOLHDL 6.1 (H) 12/05/2023 1514   LDLCALC Comment (A) 12/05/2023 1514   06/11/2011 Cholesterol 110, HDL 27, LDL 55.6, triglycerides 139  Risk Assessment/Calculations:            Physical Exam:    VS:  BP 110/74 (BP Location: Left Arm, Patient Position: Sitting, Cuff Size: Large)   Pulse 82   Ht 5' 5 (1.651 m)   Wt 262 lb 9.6 oz (119.1 kg)   SpO2 98%   BMI 43.70 kg/m     Wt Readings from Last 3 Encounters:  10/12/24 262 lb 9.6 oz (119.1 kg)  12/05/23 277 lb 8 oz (125.9 kg)  11/27/23 275 lb 9.6 oz (125 kg)     General: Alert, oriented x3, no distress, [place] obese Head: no evidence of trauma, PERRL, EOMI, no exophtalmos or lid lag, no myxedema, no xanthelasma; normal ears, nose and oropharynx Neck: normal jugular venous pulsations and no hepatojugular reflux; brisk carotid pulses without delay and no carotid bruits Chest: clear to auscultation, no signs of consolidation by percussion or palpation, normal fremitus, symmetrical and full respiratory excursions Cardiovascular: normal position and quality of the apical impulse, regular rhythm, normal first and second heart sounds, no murmurs, rubs or gallops Abdomen: no tenderness or distention, no masses by palpation, no abnormal pulsatility or arterial bruits, normal bowel sounds, no hepatosplenomegaly Extremities: no clubbing, cyanosis or edema; 2+ radial, ulnar and brachial pulses  bilaterally; 2+ right femoral, posterior tibial and dorsalis pedis pulses; 2+ left femoral, posterior tibial and dorsalis pedis pulses; no subclavian or femoral bruits Neurological: grossly nonfocal Psych: Normal mood and affect    ASSESSMENT:    1. Chronic combined systolic and diastolic congestive heart failure (HCC)   2. Nonischemic cardiomyopathy (HCC)   3. Severe obesity (BMI >= 40) (HCC)   4. Type 2 diabetes mellitus without complication, without long-term current use of insulin  (HCC)   5. Mixed hyperlipidemia    PLAN:    In order of problems listed above:  CHF: Clinically euvolemic without loop diuretics.  NYHA functional class I.  Excellent response to medical therapy for cardiomyopathy with an increase in LVEF from 20% to 35-40%.  She is to have resting tachycardia, but  this is no longer present.  She is on maximum dose of Entresto  and  maximum dose of carvedilol  as well as spironolactone  and SGLT2 inhibitor.  MRI did not show significant scar  Cardiomyopathy: Nonischemic cardiomyopathy, possibly inheritable since her father also had nonischemic cardiomyopathy that responded well to medical therapy. She had preeclampsia immediately after delivering her son 2 years ago, but her blood pressure is now very normal.  It is possible she had postpartum cardiomyopathy that did not manifest itself so noisily early on, but really her symptoms of heart failure only became evident 8 months or so after delivery, making postpartum cardiomyopathy unlikely.  Regardless, I strongly advised against further pregnancy.  She has no intention of having more children.   Morbid obesity/DM2/HLP: Markedly elevated triglycerides, low HDL, although the total cholesterol is not high.  Focus on treating insulin  resistance.  She is already on empagliflozin .  Would recommend adding a GLP-1 agonist such as Ozempic or Mounjaro.  Will review her insurance coverage.            Medication Adjustments/Labs and Tests  Ordered: Current medicines are reviewed at length with the patient today.  Concerns regarding medicines are outlined above.  Orders Placed This Encounter  Procedures   EKG 12-Lead   No orders of the defined types were placed in this encounter.   Patient Instructions  Medication Instructions:  No changes- Pharmacist will speak with you about medication options *If you need a refill on your cardiac medications before your next appointment, please call your pharmacy*  Lab Work: No changes If you have labs (blood work) drawn today and your tests are completely normal, you will receive your results only by: MyChart Message (if you have MyChart) OR A paper copy in the mail If you have any lab test that is abnormal or we need to change your treatment, we will call you to review the results.  Testing/Procedures: None ordered  Follow-Up: At [facility], you and your health needs are our priority.  As part of our continuing mission to provide you with exceptional heart care, our providers are all part of one team.  This team includes your primary Cardiologist (physician) and Advanced Practice Providers or APPs (Physician Assistants and Nurse Practitioners) who all work together to provide you with the care you need, when you need it.  Your next appointment:   1 year(s)  Provider:   Jerel Balding, MD    We recommend signing up for the patient portal called MyChart.  Sign up information is provided on this After Visit Summary.  MyChart is used to connect with patients for Virtual Visits (Telemedicine).  Patients are able to view lab/test results, encounter notes, upcoming appointments, etc.  Non-urgent messages can be sent to your provider as well.   To learn more about what you can do with MyChart, go to [email or web address].      Signed, Alicia Balding, MD  10/12/2024 6:26 PM    [facility] "

## 2024-10-21 ENCOUNTER — Telehealth: Payer: Self-pay | Admitting: Pharmacist Clinician (PhC)/ Clinical Pharmacy Specialist

## 2024-10-21 NOTE — Telephone Encounter (Signed)
 Please do PA for [facility]

## 2024-10-22 ENCOUNTER — Other Ambulatory Visit (HOSPITAL_COMMUNITY): Payer: Self-pay

## 2024-10-22 ENCOUNTER — Telehealth: Payer: Self-pay | Admitting: Pharmacy Technician

## 2024-10-22 ENCOUNTER — Telehealth: Payer: Self-pay | Admitting: Cardiovascular Disease

## 2024-10-22 DIAGNOSIS — E119 Type 2 diabetes mellitus without complications: Secondary | ICD-10-CM

## 2024-10-22 MED ORDER — MOUNJARO 2.5 MG/0.5ML ~~LOC~~ SOAJ: 2.5000 mg | SUBCUTANEOUS | 0 refills | Status: DC

## 2024-10-22 MED ORDER — MOUNJARO 2.5 MG/0.5ML ~~LOC~~ SOAJ
2.5000 mg | SUBCUTANEOUS | 0 refills | Status: DC
  Filled 2024-10-22: qty 2, 28d supply, fill #0

## 2024-10-22 NOTE — Addendum Note (Signed)
 Addended by: Julieanna Geraci L on: 10/22/2024 04:14 PM   Modules accepted: Orders

## 2024-10-22 NOTE — Telephone Encounter (Signed)
 Pharmacy Patient Advocate Encounter  Received notification from [facility] that Prior Authorization for mounjaro  has been APPROVED from 10/22/24 to 10/23/27. Ran test claim, Copay is $25.00. This test claim was processed through [facility]- copay amounts may vary at other pharmacies due to pharmacy/plan contracts, or as the patient moves through the different stages of their insurance plan.   PA #/Case ID/Reference #: [id number]

## 2024-10-22 NOTE — Telephone Encounter (Signed)
 Pt c/o medication issue:  1. Name of Medication:   tirzepatide  (MOUNJARO ) 2.5 MG/0.5ML Pen    2. How are you currently taking this medication (dosage and times per day)? N/A  3. Are you having a reaction (difficulty breathing--STAT)? N/A  4. What is your medication issue? Pharmacy requesting a diagnostic code for medication

## 2024-10-22 NOTE — Telephone Encounter (Signed)
" ° °  Pharmacy Patient Advocate Encounter   Received notification from Pt Calls Messages[facility] that prior authorization for MOUNJARO  is required/requested.   Insurance verification completed.   The patient is insured through [facility].   Per test claim: PA required; PA submitted to above mentioned insurance via Latent Key/confirmation #/EOC [facility] Status is pending  "

## 2024-10-23 ENCOUNTER — Other Ambulatory Visit: Payer: Self-pay | Admitting: Pharmacist Clinician (PhC)/ Clinical Pharmacy Specialist

## 2024-10-23 ENCOUNTER — Telehealth: Payer: Self-pay | Admitting: Cardiovascular Disease

## 2024-10-23 ENCOUNTER — Encounter: Payer: Self-pay | Admitting: Pharmacist Clinician (PhC)/ Clinical Pharmacy Specialist

## 2024-10-23 ENCOUNTER — Other Ambulatory Visit (HOSPITAL_COMMUNITY): Payer: Self-pay

## 2024-10-23 DIAGNOSIS — E119 Type 2 diabetes mellitus without complications: Secondary | ICD-10-CM

## 2024-10-23 NOTE — Telephone Encounter (Signed)
 Pt c/o medication issue:  1. Name of Medication: tirzepatide  (MOUNJARO ) 2.5 MG/0.5ML Pen   2. How are you currently taking this medication (dosage and times per day)?    3. Are you having a reaction (difficulty breathing--STAT)? no  4. What is your medication issue? Calling to get dx code for this medication. Please advise

## 2024-10-23 NOTE — Telephone Encounter (Signed)
 Per Medford Bolk, RPH: Diagnosis Code E11.9, T2DM   Called [facility] and given code.

## 2024-11-13 ENCOUNTER — Other Ambulatory Visit: Payer: Self-pay | Admitting: Cardiovascular Disease

## 2024-11-13 NOTE — Telephone Encounter (Signed)
 Refills sent

## 2024-11-17 ENCOUNTER — Other Ambulatory Visit (HOSPITAL_COMMUNITY): Payer: Self-pay

## 2024-11-17 ENCOUNTER — Other Ambulatory Visit: Payer: Self-pay | Admitting: Pharmacist Clinician (PhC)/ Clinical Pharmacy Specialist

## 2024-11-17 MED ORDER — MOUNJARO 5 MG/0.5ML ~~LOC~~ SOAJ: 5.0000 mg | SUBCUTANEOUS | 0 refills | Status: DC

## 2024-11-17 MED ORDER — MOUNJARO 5 MG/0.5ML ~~LOC~~ SOAJ
5.0000 mg | SUBCUTANEOUS | 0 refills | Status: DC
  Filled 2024-11-17: qty 2, 28d supply, fill #0

## 2024-11-17 NOTE — Addendum Note (Signed)
 Addended by: Kansas Spainhower L on: 11/17/2024 07:22 AM   Modules accepted: Orders

## 2024-11-18 ENCOUNTER — Other Ambulatory Visit (HOSPITAL_COMMUNITY): Payer: Self-pay

## 2024-11-18 ENCOUNTER — Other Ambulatory Visit (HOSPITAL_BASED_OUTPATIENT_CLINIC_OR_DEPARTMENT_OTHER): Payer: Self-pay

## 2024-11-18 MED ORDER — MOUNJARO 5 MG/0.5ML ~~LOC~~ SOAJ
5.0000 mg | SUBCUTANEOUS | 0 refills | Status: AC
  Filled 2024-11-18: qty 2, 28d supply, fill #0

## 2024-11-18 NOTE — Addendum Note (Signed)
 Addended by: Chance Munter L on: 11/18/2024 04:45 PM   Modules accepted: Orders
